# Patient Record
Sex: Female | Born: 1950 | Race: White | Hispanic: No | Marital: Married
Health system: Southern US, Community
[De-identification: ages and names within clinical notes are randomized; demographics above are authoritative.]

---

## 1998-08-28 ENCOUNTER — Other Ambulatory Visit: Admission: RE | Admit: 1998-08-28 | Discharge: 1998-08-28 | Payer: Self-pay | Admitting: *Deleted

## 1999-09-09 ENCOUNTER — Other Ambulatory Visit: Admission: RE | Admit: 1999-09-09 | Discharge: 1999-09-09 | Payer: Self-pay | Admitting: *Deleted

## 2000-10-01 ENCOUNTER — Other Ambulatory Visit: Admission: RE | Admit: 2000-10-01 | Discharge: 2000-10-01 | Payer: Self-pay | Admitting: *Deleted

## 2001-10-18 ENCOUNTER — Other Ambulatory Visit: Admission: RE | Admit: 2001-10-18 | Discharge: 2001-10-18 | Payer: Self-pay | Admitting: Obstetrics and Gynecology

## 2002-11-21 ENCOUNTER — Other Ambulatory Visit: Admission: RE | Admit: 2002-11-21 | Discharge: 2002-11-21 | Payer: Self-pay | Admitting: Obstetrics and Gynecology

## 2003-03-27 ENCOUNTER — Ambulatory Visit (HOSPITAL_COMMUNITY): Admission: RE | Admit: 2003-03-27 | Discharge: 2003-03-27 | Payer: Self-pay | Admitting: Gastroenterology

## 2009-10-16 ENCOUNTER — Encounter: Payer: Self-pay | Admitting: Cardiology

## 2009-10-22 ENCOUNTER — Encounter: Payer: Self-pay | Admitting: Cardiology

## 2009-11-08 ENCOUNTER — Ambulatory Visit: Payer: Self-pay | Admitting: Cardiology

## 2009-11-08 DIAGNOSIS — R079 Chest pain, unspecified: Secondary | ICD-10-CM | POA: Insufficient documentation

## 2009-11-29 ENCOUNTER — Ambulatory Visit: Payer: Self-pay

## 2009-11-29 ENCOUNTER — Encounter: Payer: Self-pay | Admitting: Cardiology

## 2009-11-29 ENCOUNTER — Ambulatory Visit (HOSPITAL_COMMUNITY): Admission: RE | Admit: 2009-11-29 | Discharge: 2009-11-29 | Payer: Self-pay | Admitting: Cardiology

## 2009-11-29 ENCOUNTER — Ambulatory Visit: Payer: Self-pay | Admitting: Cardiology

## 2010-06-13 NOTE — Progress Notes (Signed)
Summary: [facility] [place] Office Visit Note  [facility] at [facility] Visit Note   Imported By: Roderic Ovens 11/16/2009 10:46:40  _____________________________________________________________________  External Attachment:    Type:   Image     Comment:   External Document

## 2010-06-13 NOTE — Assessment & Plan Note (Signed)
Summary: np6/chest pain/   Primary Provider:  Dr. Raquel James   History of Present Illness: 60 yo with minimal past medical history presents for evaluation of chest pain.  Patient has episodes of chest tightness that can last for several hours at a time and resolve spontaneously.  Sometimes there is radiation to the left arm.  The pain is not exertional.  It has been occurring every few days for the last 2 months.  She relates its onset to increased stress at her work.  The tightness seems to come on when the stress is worse.  She had a normal stress test in 2003 after having similar episodes of chest pain.  No exertional dyspnea.    ECG: NSR, T wave flattening in precordial leads.   Labs (6/11): K 3.9, creatinine 0.8, HDL 66, LDL 59, free T4 and TSH normal  Current Medications (verified): 1)  Vitamin D3 2000 Unit Caps (Cholecalciferol) .... Take One Capsule Daily  Allergies (verified): No Known Drug Allergies  Past History:  Past Medical History: Minimal past medical history  Family History: Grandmother with CVA, Grandfather with MI at 61 and CHF.   Social History: Married with 2 children.  Works as a Runner, broadcasting/film/video.  No smoking.  No illicit drugs.   Review of Systems       All systems reviewed and negative except as per HPI.   Vital Signs:  Patient profile:   60 year old female Height:      61.5 inches Weight:      156 pounds BMI:     29.10 Pulse rate:   72 / minute Pulse rhythm:   regular BP sitting:   121 / 88  (left arm) Cuff size:   regular  Vitals Entered By: Judithe Modest CMA (November 08, 2009 2:49 PM)  Physical Exam  General:  Well developed, well nourished, in no acute distress. Head:  normocephalic and atraumatic Nose:  no deformity, discharge, inflammation, or lesions Mouth:  Teeth, gums and palate normal. Oral mucosa normal. Neck:  Neck supple, no JVD. No masses, thyromegaly or abnormal cervical nodes. Lungs:  Clear bilaterally to auscultation and  percussion. Heart:  Non-displaced PMI, chest non-tender; regular rate and rhythm, S1, S2 without murmurs, rubs or gallops. Carotid upstroke normal, no bruit. Pedals normal pulses. No edema, no varicosities. Abdomen:  Bowel sounds positive; abdomen soft and non-tender without masses, organomegaly, or hernias noted. No hepatosplenomegaly. Msk:  Back normal, normal gait. Muscle strength and tone normal. Extremities:  No clubbing or cyanosis. Neurologic:  Alert and oriented x 3. Skin:  Intact without lesions or rashes. Psych:  Normal affect.   Impression & Recommendations:  Problem # 1:  CHEST PAIN (ICD-786.50) Atypical chest pain.  This pain lasts for several hours at a time and is nonexertional.  It seems to be related to stress, and may be a stress response.  I will have her do a stress echo, and if this is normal, no further workup is required.   Other Orders: Stress Echo (Stress Echo)  Patient Instructions: 1)  Your physician has requested that you have a stress echocardiogram. For further information please visit [email or web address]  Please follow instruction sheet as given. 2)  Your physician recommends that you schedule a follow-up appointment as needed with Dr Shirlee Latch.

## 2010-06-13 NOTE — Letter (Signed)
Summary: [facility] [place] Office Visit Note  [facility] at [facility] Visit Note   Imported By: Roderic Ovens 11/16/2009 10:49:39  _____________________________________________________________________  External Attachment:    Type:   Image     Comment:   External Document

## 2010-06-23 ENCOUNTER — Emergency Department (HOSPITAL_COMMUNITY)
Admission: EM | Admit: 2010-06-23 | Discharge: 2010-06-23 | Disposition: A | Discharge status: Home / Self Care | Payer: BC Managed Care – PPO | Attending: Emergency Medicine | Admitting: Emergency Medicine

## 2010-06-23 DIAGNOSIS — I10 Essential (primary) hypertension: Secondary | ICD-10-CM | POA: Insufficient documentation

## 2010-06-23 LAB — URINALYSIS, ROUTINE W REFLEX MICROSCOPIC
Bilirubin Urine: NEGATIVE
Hgb urine dipstick: NEGATIVE
Ketones, ur: NEGATIVE mg/dL
Nitrite: NEGATIVE
Urobilinogen, UA: 0.2 mg/dL (ref 0.0–1.0)

## 2010-06-23 LAB — POCT I-STAT, CHEM 8
Calcium, Ion: 1.2 mmol/L (ref 1.12–1.32)
Glucose, Bld: 102 mg/dL — ABNORMAL HIGH (ref 70–99)
HCT: 42 % (ref 36.0–46.0)
Hemoglobin: 14.3 g/dL (ref 12.0–15.0)
TCO2: 26 mmol/L (ref 0–100)

## 2010-09-27 NOTE — Op Note (Signed)
   NAME:  Dorothy Mason, Dorothy Mason                           ACCOUNT NO.:  [id number]   MEDICAL RECORD NO.:  [id number]                   PATIENT TYPE:  AMB   LOCATION:  ENDO                                 FACILITY:  [facility]   PHYSICIAN:  Anselmo Rod, M.D.               DATE OF BIRTH:  03-02-1951   DATE OF PROCEDURE:  03/27/2003  DATE OF DISCHARGE:                                 OPERATIVE REPORT   PROCEDURE PERFORMED:  Screening colonoscopy.   ENDOSCOPIST:  Charna Elizabeth, M.D.   INSTRUMENT USED:  Olympus video colonoscope.   INDICATIONS FOR PROCEDURE:  The patient is a 60 year old white female  undergoing screening colonoscopy to rule out colonic polyps, masses,  hemorrhoids, etc.   PREPROCEDURE PREPARATION:  Informed consent was procured from the patient.  The patient was fasted for eight hours prior to the procedure and prepped  with a bottle of magnesium citrate and a gallon of GoLYTELY the night prior  to the procedure.   PREPROCEDURE PHYSICAL:  The patient had stable vital signs.  Neck supple.  Chest clear to auscultation.  S1 and S2 regular.  Abdomen soft with normal  bowel sounds.   DESCRIPTION OF PROCEDURE:  The patient was placed in left lateral decubitus  position and sedated with 70 mg of Demerol and 7 mg of Versed intravenously.  Once the patient was adequately sedated and maintained on low flow oxygen  and continuous cardiac monitoring, the Olympus video colonoscope was  advanced from the rectum to the cecum and terminal ileum without difficulty.  The entire exam was normal.  No masses, polyps, erosions, ulcerations or  diverticula were seen.  Retroflexion in the rectum revealed no abnormality.  The patient tolerated the procedure well without complication.   IMPRESSION:  Normal colonoscopy up to the cecum and terminal ileum.  No  masses, polyps, or diverticulosis seen.   RECOMMENDATIONS:  1. A high fiber diet with liberal fluid intake has been advised.  2.     Repeat  colorectal cancer screening is recommended in the next 10 years     unless the patient develops any abnormal symptoms in the interim.  3. Outpatient follow-up as need arises in the future.                                               Anselmo Rod, M.D.    JNM/MEDQ  D:  03/27/2003  T:  03/27/2003  Job:  [id number]   cc:   Talmadge Coventry, M.D.  [address], [address]  [place]  [place] [address]  Fax: [phone]

## 2010-11-14 ENCOUNTER — Encounter: Payer: Self-pay | Admitting: Cardiology

## 2012-05-24 ENCOUNTER — Other Ambulatory Visit: Payer: Self-pay | Admitting: Family Medicine

## 2012-05-24 ENCOUNTER — Other Ambulatory Visit (HOSPITAL_COMMUNITY)
Admission: RE | Admit: 2012-05-24 | Discharge: 2012-05-24 | Disposition: A | Discharge status: Home / Self Care | Payer: BC Managed Care – PPO | Source: Ambulatory Visit | Attending: Family Medicine | Admitting: Family Medicine

## 2012-05-24 DIAGNOSIS — Z124 Encounter for screening for malignant neoplasm of cervix: Secondary | ICD-10-CM | POA: Insufficient documentation

## 2015-05-15 DIAGNOSIS — F419 Anxiety disorder, unspecified: Secondary | ICD-10-CM | POA: Diagnosis not present

## 2015-05-15 DIAGNOSIS — E559 Vitamin D deficiency, unspecified: Secondary | ICD-10-CM | POA: Diagnosis not present

## 2015-05-15 DIAGNOSIS — E039 Hypothyroidism, unspecified: Secondary | ICD-10-CM | POA: Diagnosis not present

## 2015-05-15 DIAGNOSIS — I10 Essential (primary) hypertension: Secondary | ICD-10-CM | POA: Diagnosis not present

## 2015-09-03 DIAGNOSIS — J029 Acute pharyngitis, unspecified: Secondary | ICD-10-CM | POA: Diagnosis not present

## 2015-10-10 DIAGNOSIS — I10 Essential (primary) hypertension: Secondary | ICD-10-CM | POA: Diagnosis not present

## 2015-10-10 DIAGNOSIS — R079 Chest pain, unspecified: Secondary | ICD-10-CM | POA: Diagnosis not present

## 2015-10-10 DIAGNOSIS — R0789 Other chest pain: Secondary | ICD-10-CM | POA: Diagnosis not present

## 2015-11-21 DIAGNOSIS — I10 Essential (primary) hypertension: Secondary | ICD-10-CM | POA: Diagnosis not present

## 2015-11-21 DIAGNOSIS — E039 Hypothyroidism, unspecified: Secondary | ICD-10-CM | POA: Diagnosis not present

## 2015-11-21 DIAGNOSIS — E559 Vitamin D deficiency, unspecified: Secondary | ICD-10-CM | POA: Diagnosis not present

## 2016-02-20 DIAGNOSIS — Z1231 Encounter for screening mammogram for malignant neoplasm of breast: Secondary | ICD-10-CM | POA: Diagnosis not present

## 2016-02-20 DIAGNOSIS — Z803 Family history of malignant neoplasm of breast: Secondary | ICD-10-CM | POA: Diagnosis not present

## 2016-12-15 ENCOUNTER — Other Ambulatory Visit (HOSPITAL_COMMUNITY)
Admission: RE | Admit: 2016-12-15 | Discharge: 2016-12-15 | Disposition: A | Discharge status: Home / Self Care | Payer: Medicare Other | Source: Ambulatory Visit | Attending: Family Medicine | Admitting: Family Medicine

## 2016-12-15 ENCOUNTER — Other Ambulatory Visit: Payer: Self-pay | Admitting: Family Medicine

## 2016-12-15 DIAGNOSIS — Z124 Encounter for screening for malignant neoplasm of cervix: Secondary | ICD-10-CM | POA: Diagnosis present

## 2016-12-17 LAB — CYTOLOGY - PAP
DIAGNOSIS: NEGATIVE
HPV: NOT DETECTED

## 2018-11-04 ENCOUNTER — Encounter: Payer: Self-pay | Admitting: Internal Medicine

## 2019-03-04 ENCOUNTER — Encounter (INDEPENDENT_AMBULATORY_CARE_PROVIDER_SITE_OTHER): Payer: Self-pay

## 2019-05-16 HISTORY — PX: CRANIOTOMY: SHX93

## 2019-06-06 ENCOUNTER — Telehealth: Payer: Self-pay | Admitting: *Deleted

## 2019-06-06 NOTE — Telephone Encounter (Signed)
error 

## 2019-06-08 ENCOUNTER — Encounter: Payer: Self-pay | Admitting: Radiation Therapy

## 2019-06-08 NOTE — Progress Notes (Signed)
New GBM referral received from Dr. Drema Dallas office. Pt needs to be seen for post operative chemo/radiation. (incomplete resection)   Surgeon: Dr. Aura Fey, [facility], [place]. [phone]  Hospital procedure performed: [facility] in [facility]   Date of surgery: 05/16/19  Doctor managing her rehab: Dr. Rayford Halsted.  [id number] Planned discharge from Rehab facility on Monday 1/25  Case Manager: [facility] @ [facility] - [phone]  Pt's husband: [phone], mid-day appointments preferred since driving from [place].    Mont Dutton R.T.(R)(T) Radiation Special Procedures Navigator

## 2019-06-13 ENCOUNTER — Ambulatory Visit
Admission: RE | Admit: 2019-06-13 | Discharge: 2019-06-13 | Disposition: A | Discharge status: Home / Self Care | Payer: Self-pay | Source: Ambulatory Visit | Attending: Radiation Oncology | Admitting: Radiation Oncology

## 2019-06-13 ENCOUNTER — Other Ambulatory Visit: Payer: Self-pay | Admitting: Radiation Oncology

## 2019-06-13 DIAGNOSIS — C719 Malignant neoplasm of brain, unspecified: Secondary | ICD-10-CM

## 2019-06-15 ENCOUNTER — Inpatient Hospital Stay
Admission: RE | Admit: 2019-06-15 | Discharge: 2019-06-15 | Disposition: A | Discharge status: Home / Self Care | Payer: Self-pay | Source: Ambulatory Visit | Attending: Internal Medicine | Admitting: Internal Medicine

## 2019-06-15 ENCOUNTER — Other Ambulatory Visit (HOSPITAL_COMMUNITY): Payer: Self-pay | Admitting: Internal Medicine

## 2019-06-15 ENCOUNTER — Ambulatory Visit
Admission: RE | Admit: 2019-06-15 | Discharge: 2019-06-15 | Disposition: A | Discharge status: Home / Self Care | Payer: Self-pay | Source: Ambulatory Visit | Attending: Internal Medicine | Admitting: Internal Medicine

## 2019-06-15 DIAGNOSIS — C801 Malignant (primary) neoplasm, unspecified: Secondary | ICD-10-CM

## 2019-06-20 ENCOUNTER — Telehealth: Payer: Self-pay | Admitting: Internal Medicine

## 2019-06-20 ENCOUNTER — Other Ambulatory Visit: Payer: Self-pay

## 2019-06-20 ENCOUNTER — Inpatient Hospital Stay: Payer: Medicare PPO | Attending: Internal Medicine | Admitting: Internal Medicine

## 2019-06-20 DIAGNOSIS — Z79899 Other long term (current) drug therapy: Secondary | ICD-10-CM | POA: Insufficient documentation

## 2019-06-20 DIAGNOSIS — C719 Malignant neoplasm of brain, unspecified: Secondary | ICD-10-CM | POA: Diagnosis present

## 2019-06-20 DIAGNOSIS — C711 Malignant neoplasm of frontal lobe: Secondary | ICD-10-CM | POA: Insufficient documentation

## 2019-06-20 NOTE — Progress Notes (Signed)
[facility] [place], [place] [address] [phone]   New Patient Evaluation  Date of Service: 06/20/19 Patient Name: Dorothy Mason Patient MRN: [id number] Patient DOB: 1950/08/27 Provider: Ventura Sellers, MD  Identifying Statement:  Dorothy Mason is a 69 y.o. female with left frontal glioblastoma who presents for initial consultation and evaluation.    Referring Provider: No referring provider defined for this encounter.  Oncologic History: Oncology History  Glioblastoma, IDH-wildtype ([place])  05/16/2019 Surgery   Craniotomy, debulking resection in [place], [place]     Biomarkers:  MGMT Unknown.  IDH 1/2 Wild type.  EGFR Unknown  TERT Unknown   History of Present Illness: The patient's records from the referring physician were obtained and reviewed and the patient interviewed to confirm this HPI.  Dorothy Mason initially presented to medical attention in late December with several weeks of progressive word finding difficuly, confusion, and gait impairment.  She obtained CNS imaging locally in [place], [place] at request of her PCP which demonstrated enhancing frontal masses consistent with primary brain tumor.  She then underwent craniotomy and resection, also in [place], for debulking of the mass.  This was followed by several weeks of inpatient rehabilitation, where she was discharged with independent gait and otherwise fully intact.  She does acknowledge some difficulty with communication, and "slower" pace of activities such as dressing in the morning.  She denies any seizures or headaches, and has fully weaned off dexamethasone.      Medications: Current Outpatient Medications on File Prior to Visit  Medication Sig Dispense Refill  . Cholecalciferol (VITAMIN D3) 2000 UNITS capsule Take 2,000 Units by mouth daily.       No current facility-administered medications on file prior to visit.    Allergies:  Allergies   Allergen Reactions  . Amoxicillin    Past Medical History: No past medical history on file. Past Surgical History: none  Social History:  Social History   Socioeconomic History  . Marital status: Single    Spouse name: Not on file  . Number of children: Not on file  . Years of education: Not on file  . Highest education level: Not on file  Occupational History  . Not on file  Tobacco Use  . Smoking status: Unknown If Ever Smoked  . Tobacco comment: no smoking   Substance and Sexual Activity  . Alcohol use: No  . Drug use: Not on file  . Sexual activity: Not on file  Other Topics Concern  . 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    Social Determinants of Health   Financial Resource Strain:   . Difficulty of Paying Living Expenses: Not on file  Food Insecurity:   . Worried About Charity fundraiser in the Last Year: Not on file  . Ran Out of Food in the Last Year: Not on file  Transportation Needs:   . Lack of Transportation (Medical): Not on file  . Lack of Transportation (Non-Medical): Not on file  Physical Activity:   . Days of Exercise per Week: Not on file  . Minutes of Exercise per Session: Not on file  Stress:   . Feeling of Stress : Not on file  Social Connections:   . Frequency of Communication with Friends and Family: Not on file  . Frequency of Social Gatherings with Friends and Family: Not on file  .  Attends Religious Services: Not on file  . Active Member of Clubs or Organizations: Not on file  . Attends Archivist Meetings: Not on file  . Marital Status: Not on file  Intimate Partner Violence:   . Fear of Current or Ex-Partner: Not on file  . Emotionally Abused: Not on file  . Physically Abused: Not on file  . Sexually Abused: Not on file   Family History:  Family History  Problem Relation Age of Onset  . Stroke Unknown        grandmother  . Heart attack Unknown        grandfather - 15  and CHF     Review of Systems: Constitutional: Doesn't report fevers, chills or abnormal weight loss Eyes: Doesn't report blurriness of vision Ears, nose, mouth, throat, and face: Doesn't report sore throat Respiratory: Doesn't report cough, dyspnea or wheezes Cardiovascular: Doesn't report palpitation, chest discomfort  Gastrointestinal:  Doesn't report nausea, constipation, diarrhea GU: Doesn't report incontinence Skin: Doesn't report skin rashes Neurological: Per HPI Musculoskeletal: Doesn't report joint pain Behavioral/Psych: Doesn't report anxiety  Physical Exam: Vitals:   06/20/19 1047  BP: 109/70  Pulse: 77  Resp: 18  Temp: 97.8 F (36.6 C)  SpO2: 97%   KPS: 80. General: Alert, cooperative, pleasant, in no acute distress Head: Craniotomy scar EENT: No conjunctival injection or scleral icterus.  Lungs: Resp effort normal Cardiac: Regular rate Abdomen: Non-distended abdomen Skin: No rashes cyanosis or petechiae. Extremities: No clubbing or edema  Neurologic Exam: Mental Status: Awake, alert, attentive to examiner. Oriented to self and environment. Language has some impairment with fluency and comprehension.  Age advanced psychomotor slowing.  Cranial Nerves: Visual acuity is grossly normal. Visual fields are full. Extra-ocular movements intact. No ptosis. Face is symmetric Motor: Tone and bulk are normal. Power is full in both arms and legs. Reflexes are symmetric, no pathologic reflexes present.  Sensory: Intact to light touch Gait: Normal, deferred tandem.   Labs: I have reviewed the data as listed    Component Value Date/Time   NA 142 06/23/2010 0053   K 3.2 (L) 06/23/2010 0053   CL 104 06/23/2010 0053   GLUCOSE 102 (H) 06/23/2010 0053   BUN 13 06/23/2010 0053   CREATININE 0.9 06/23/2010 0053   Lab Results  Component Value Date   HGB 14.3 06/23/2010   HCT 42.0 06/23/2010    Pre-op MRI:   Post-op MRI:     Pathology (outside report, pending  scan into Epic): Glioblastoma, IDH-wild type with giant cell features  Assessment/Plan Glioblastoma, IDH-wildtype ([place])  We appreciate the opportunity to participate in the care of Dorothy Mason.  We extensively discussed her pathology, imaging, and clinical findings.  We reviewed prognosis and treatment pathways for glioblastoma.  We are encouraged by her good quality resection and nearly full recovery of functional status through aggressive rehabilitation.    Further tumor bio-markers are pending and may be expanded through whole exome sequencing if deemed appropriate.  We recommended moving forward with 42-day intensity-modulated radiation therapy with concurrent Temozolomide, dosed at '75mg'$ /m2 daily.  We reviewed side effecits of Temozolomide including fatigue, cytopenias, nausea/vomting, constipation.  Chemotherapy should be held for the following:  ANC less than 1,000  Platelets less than 100,000  LFT or creatinine greater than 2x ULN  If clinical concerns/contraindications develop    She will meet with radiation oncology tomorrow to discuss plans and timing for radiation therapy.  We are happy to see her for further discussion  when she returns for CT-sim.  Keppra may be discontinued now that she is removed from post-operative prophylaxis period.    Screening for potential clinical trials was performed and discussed using eligibility criteria for active protocols at [facility], loco-regional tertiary centers, as well as national database available on [email or web address].com.    The patient is not a candidate for a research protocol at this time due to no suitable study identified.   We spent twenty additional minutes teaching regarding the natural history, biology, and historical experience in the treatment of brain tumors. We then discussed in detail the current recommendations for therapy focusing on the mode of administration, mechanism of action, anticipated toxicities, and  quality of life issues associated with this plan. We also provided teaching sheets for the patient to take home as an additional resource.  She may return for further evaluation following CT-sim, and then will return to clinic during weeks 2, 4 and 6 of IMRT with labs for evaluation.  All questions were answered. The patient knows to call the clinic with any problems, questions or concerns. No barriers to learning were detected.  The total time spent in the encounter was 60 minutes and more than 50% was on counseling and review of test results   Ventura Sellers, MD Medical Director of Neuro-Oncology [facility] at [place] 06/20/19 10:48 AM

## 2019-06-20 NOTE — Telephone Encounter (Signed)
No los per 2/8. 

## 2019-06-20 NOTE — Progress Notes (Signed)
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  Residual tumor both enhancing and nonenhancing in both frontal lobes.  No ischemic infarcts seen.  No acute hematoma has developed post surgery.  CT AP 05/11/2019: No evidence of metastatic disease to the abdomen or pelvis.  CT Head 05/11/2019: Multiple cystic and solid enhancing lesions, suggestive of metastases.  Synchronous primary tumor is felt to be less likely.  CT Chest 05/11/2019: 4 mm subpleural nodular density lateral aspect of the right middle lobe.  No pulmonary parenchymal mass or adenopathy is identified.  Multifocal nonspecific ground glass pulmonary parenchymal density possibly representing mild edema or pneumonitis.  Multiple small sub-centimeter thyroid hypodensities possibly representing cysts and or nodules.  CT Head 05/10/2019: Large cystic lesion with surrounding edema and associated mass effect located in the left anterior parietal lobe with smaller cystic lesion in the right anterior parietal lobe.  There are highly concerning for malignancy.  MRI Brain 05/10/2019: Multiple enhancing lesions as detailed suspicious for metastatic neoplastic disease.  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  Past or anticipated interventions, if any, per neurosurgery:  Dr.Andrew Rice-[place] [facility] -05/16/19 left craniotomy   Past or anticipated interventions, if any, per medical oncology:  Dr. Mickeal Skinner 06/20/2019 -We recommend moving forward with 42-day intensity-modulated radiation therapy with concurrent temozolomide (temodar), dosed at 75 mg/m2 daily.   Dose of Decadron, if applicable: No   Recent neurologic symptoms, if any:   Seizures:  No  Headaches: occasional pulsing headaches, nothing major.  Nausea: No  Dizziness/ataxia: No  Difficulty with hand coordination: No  Focal numbness/weakness: No  Visual deficits/changes: No  Confusion/Memory deficits: Yes, some.   SAFETY ISSUES:  Prior radiation? No  Pacemaker/ICD? No  Possible current pregnancy?   Is the patient on methotrexate? No  Additional Complaints / other details:  -Transitioning off seizure meds. -Lives in [place]

## 2019-06-21 ENCOUNTER — Encounter: Payer: Self-pay | Admitting: Radiation Oncology

## 2019-06-21 ENCOUNTER — Other Ambulatory Visit: Payer: Self-pay

## 2019-06-21 ENCOUNTER — Ambulatory Visit
Admission: RE | Admit: 2019-06-21 | Discharge: 2019-06-21 | Disposition: A | Discharge status: Home / Self Care | Payer: Medicare PPO | Source: Ambulatory Visit | Attending: Radiation Oncology | Admitting: Radiation Oncology

## 2019-06-21 VITALS — Ht 61.0 in | Wt 146.0 lb

## 2019-06-21 DIAGNOSIS — F40240 Claustrophobia: Secondary | ICD-10-CM

## 2019-06-21 DIAGNOSIS — C719 Malignant neoplasm of brain, unspecified: Secondary | ICD-10-CM

## 2019-06-21 DIAGNOSIS — C711 Malignant neoplasm of frontal lobe: Secondary | ICD-10-CM | POA: Insufficient documentation

## 2019-06-21 DIAGNOSIS — Z510 Encounter for antineoplastic radiation therapy: Secondary | ICD-10-CM | POA: Insufficient documentation

## 2019-06-21 MED ORDER — LORAZEPAM 0.5 MG PO TABS: ORAL_TABLET | ORAL | 0 refills | Status: AC

## 2019-06-21 NOTE — Progress Notes (Signed)
Radiation Oncology         [phone] ________________________________  Initial Outpatient Consultation - Conducted via telephone due to current COVID-19 concerns for limiting patient exposure  I spoke with the patient to conduct this consult visit via telephone to spare the patient unnecessary potential exposure in the healthcare setting during the current COVID-19 pandemic. The patient was notified in advance and was offered a [place] meeting to allow for face to face communication but unfortunately reported that they did not have the appropriate resources/technology to support such a visit and instead preferred to proceed with a telephone consult.   ________________________________  Name: Dorothy Mason        MRN: [id number]  Date of Service: 06/21/2019 DOB: 03/11/1951  KZ:[id number], Provider Not In  Essie Christine, MD     REFERRING PHYSICIAN: Essie Christine, MD   DIAGNOSIS: The primary encounter diagnosis was Glioblastoma, IDH-wildtype ([place]). A diagnosis of Claustrophobia was also pertinent to this visit.   HISTORY OF PRESENT ILLNESS: Dorothy Mason is a 69 y.o. female seen at the request of Dr. Mickeal Skinner and Dr. Drema Dallas for a recently diagnosed glioblastoma multiforme.  The patient has been living in [facility].  In December 2020 she developed word finding difficulty, confusion and gait impairment.  She proceeded with evaluation and underwent an MRI of the brain on 05/10/2019.  This revealed a 5.8 x 4.3 x 5.2 cm mixed solid and cystic mass involving the left frontal right lobe with avid enhancement of the solid component and curvilinear enhancement along the periphery of the cystic component.  There was moderate vasogenic edema the mass effaces posteriorly displacing the frontal horn in the left lateral ventricle and partially effaces the frontal horn of the right lateral ventricle.  The mass did extend across the midline 10 to 11 mm.  There was also an enhancing mass superior  laterally adjacent to the right lateral ventricle at the level of the junction of the frontal horn and body laterally adjacent to the mass there was a 16 x 12 x 15 mm focus of subacute parenchymal hemorrhage.  There were also 2 subcentimeter foci of acute or early subacute parenchymal hemorrhage anterior and medial to the collection and a 5 to 6 mm enhancing nodule in the superior lateral aspect of the left lateral ventricle at the junction of the body and atrium concerning for ependymal enhancement.  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  Metastatic work-up was negative.  She subsequently underwent surgical resection on 05/16/2019.  Final pathology was consistent with glioblastoma.  Postoperative imaging on 05/17/2019 revealed evidence of left frontal craniotomy with subtotal tumor resection debulking of the medial enhancing margin of the tumor was noted and residual enhancement medially with slight subfalcine herniation was unchanged.  Rim of enhancing tumor seen extending to the left frontal horn was noted as well and unchanged.  The small hemorrhagic tumor nodule is identified in the right frontal lobe that was stable and tumoral cyst extending to the right frontal horn that was also unchanged.  No intraventricular seeding of tumor was noted.  She did have several weeks of inpatient rehabilitation and was discharged with an independent gait.  She met with Dr. 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  She is contacted today to discuss the rationale for chemoradiation.  After meeting  with Dr. Mickeal Skinner, the plan is to move forward in the near future with radiotherapy to her persistent enhancement in surgical cavity.    PREVIOUS RADIATION THERAPY: No   PAST MEDICAL HISTORY: History reviewed. No pertinent past medical history.     PAST SURGICAL HISTORY: Past  Surgical History:  Procedure Laterality Date  . CRANIOTOMY  05/16/2019   resection of glioblastoma.     FAMILY HISTORY:  Family History  Problem Relation Age of Onset  . Stroke Other        grandmother  . Heart attack Other        grandfather - 25 and CHF      SOCIAL HISTORY:  has an unknown smoking status. She has never used smokeless tobacco. She reports that she does not drink alcohol.  The patient is married.  She lives in [place], she is a retired Magazine features editor in the [employer]    ALLERGIES: Amoxicillin   MEDICATIONS:  Current Outpatient Medications  Medication Sig Dispense Refill  . amLODipine (NORVASC) 5 MG tablet Take 5 mg by mouth daily.    Marland Kitchen aspirin 325 MG tablet Take 325 mg by mouth daily.    . Cholecalciferol (VITAMIN D3) 2000 UNITS capsule Take 2,000 Units by mouth daily.      Marland Kitchen lisinopril (ZESTRIL) 40 MG tablet Take 1 tablet by mouth daily.    Marland Kitchen LORazepam (ATIVAN) 0.5 MG tablet 1 tablet po 30 minutes prior to radiation or MRI 40 tablet 0   No current facility-administered medications for this encounter.     REVIEW OF SYSTEMS: On review of systems, the patient reports that she is doing well overall. She denies any chest pain, shortness of breath, cough, fevers, chills, night sweats, unintended weight changes. She reports no gait disturbance. Her husband confirms that she is not confused or having any changes in speech or behavior. She denies any bowel or bladder disturbances, and denies abdominal pain, nausea or vomiting. She denies any new musculoskeletal or joint aches or pains. A complete review of systems is obtained and is otherwise negative.     PHYSICAL EXAM:  Unable to assess due to encounter type.  ECOG = 0  0 - Asymptomatic (Fully active, able to carry on all predisease activities without restriction)  1 - Symptomatic but completely ambulatory (Restricted in physically strenuous activity but  ambulatory and able to carry out work of a light or sedentary nature. For example, light housework, office work)  2 - Symptomatic, <50% in bed during the day (Ambulatory and capable of all self care but unable to carry out any work activities. Up and about more than 50% of waking hours)  3 - Symptomatic, >50% in bed, but not bedbound (Capable of only limited self-care, confined to bed or chair 50% or more of waking hours)  4 - Bedbound (Completely disabled. Cannot carry on any self-care. Totally confined to bed or chair)  5 - Death   Eustace Pen MM, Creech RH, Tormey DC, et al. [phone]). "Toxicity and response criteria of the [facility]". [place] Oncol. 5 (6): 649-55    LABORATORY DATA:  Lab Results  Component Value Date   HGB 14.3 06/23/2010   HCT 42.0 06/23/2010   Lab Results  Component Value Date   NA 142 06/23/2010   K 3.2 (L) 06/23/2010   CL 104 06/23/2010   No results found for: ALT, AST, GGT, ALKPHOS, BILITOT    RADIOGRAPHY: No results  found.     IMPRESSION/PLAN: 1. Bifrontal glioblastoma. Dr. Lisbeth Renshaw discusses the pathology findings and reviews the nature of primary brain malignancy.  He discusses the rationale for postoperative chemoradiation to the surgical site and any areas of persistent enhancement. We also discussed alternative therapies such as Optune. At the conclusion of this discussion she elects with chemoRT. We discussed the risks, benefits, short, and long term effects of radiotherapy, and the patient is interested in proceeding. Dr. Lisbeth Renshaw discusses the delivery and logistics of radiotherapy and anticipates a course of 6 weeks of radiotherapy.  She will return for simulation on Thursday at which time she will signed written consent to proceed. We anticipate starting treatment on 07/04/19 due to their plans to be in [place] next week for the second covid vaccine. 2. Claustrophobia. We discussed the rationale to use Ativan prior to simulation, as  well as if needed for treatment or subsequent MRI studies. She is in agreement to try this after we reviewed the side effect profile. A new rx was eprescribed to her pharmacy. 3. Transportation considerations. The patient is going to be temporarily staying in [place] for the duration of radiotherapy. She lived in [place] prior to retirement and only recently relocated to [place]. She plans to continue medical care in [place].   Given current concerns for patient exposure during the COVID-19 pandemic, this encounter was conducted via telephone.  The patient has given verbal consent for this type of encounter. The time spent during this encounter was 60 minutes and 50% of that time was spent in the preparation, discussion, and coordination of her care. The attendants for this meeting include Dr. Lisbeth Renshaw, Shona Simpson, [facility] and Hanley Hays and her husband C.S. Rosana Berger. During the encounter, Dr. Lisbeth Renshaw and Shona Simpson [facility] were located at [facility] Radiation Oncology Department.  Hanley Hays and her husband were located at a hotel they plan to stay at during treatment.  The above documentation reflects my direct findings during this shared patient visit. Please see the separate note by Dr. Lisbeth Renshaw on this date for the remainder of the patient's plan of care.    Carola Rhine, PAC

## 2019-06-22 DIAGNOSIS — F40240 Claustrophobia: Secondary | ICD-10-CM | POA: Insufficient documentation

## 2019-06-22 NOTE — Addendum Note (Signed)
Encounter addended by: Kyung Rudd, MD on: 06/22/2019 8:38 AM  Actions taken: Problem List modified, Visit diagnoses modified

## 2019-06-23 ENCOUNTER — Ambulatory Visit: Admission: RE | Admit: 2019-06-23 | Payer: Medicare PPO | Source: Ambulatory Visit | Admitting: Radiation Oncology

## 2019-06-23 DIAGNOSIS — C711 Malignant neoplasm of frontal lobe: Secondary | ICD-10-CM | POA: Diagnosis present

## 2019-06-23 DIAGNOSIS — Z510 Encounter for antineoplastic radiation therapy: Secondary | ICD-10-CM | POA: Diagnosis present

## 2019-06-24 ENCOUNTER — Telehealth: Payer: Self-pay | Admitting: *Deleted

## 2019-06-24 ENCOUNTER — Telehealth: Payer: Self-pay | Admitting: Pharmacist

## 2019-06-24 ENCOUNTER — Other Ambulatory Visit: Payer: Self-pay | Admitting: Internal Medicine

## 2019-06-24 DIAGNOSIS — C711 Malignant neoplasm of frontal lobe: Secondary | ICD-10-CM

## 2019-06-24 MED ORDER — LEVETIRACETAM 500 MG PO TABS: 500.0000 mg | ORAL_TABLET | Freq: Two times a day (BID) | ORAL | Status: DC

## 2019-06-24 MED ORDER — ONDANSETRON HCL 8 MG PO TABS: 8.0000 mg | ORAL_TABLET | Freq: Two times a day (BID) | ORAL | 1 refills | Status: DC | PRN

## 2019-06-24 MED ORDER — TEMOZOLOMIDE 100 MG PO CAPS: 100.0000 mg | ORAL_CAPSULE | Freq: Every day | ORAL | 0 refills | Status: AC

## 2019-06-24 MED ORDER — TEMOZOLOMIDE 20 MG PO CAPS: 20.0000 mg | ORAL_CAPSULE | Freq: Every day | ORAL | 0 refills | Status: AC

## 2019-06-24 NOTE — Progress Notes (Signed)
START ON PATHWAY REGIMEN - Neuro     One cycle, daily for 42 days concurrent with RT:     Temozolomide   **Always confirm dose/schedule in your pharmacy ordering system**  Patient Characteristics: Glioblastoma (Grade IV Glioma), Newly Diagnosed / Treatment Naive, Good Performance Status and/or Younger Patient, MGMT Promoter Unmethylated/Unknown Disease Classification: Glioma Disease Classification: Glioblastoma (Grade IV Glioma) Disease Status: Newly Diagnosed / Treatment Naive Performance Status: Good Performance Status and/or Younger Patient MGMT Promoter Methylation Status: Awaiting Test Results Intent of Therapy: Non-Curative / Palliative Intent, Discussed with Patient 

## 2019-06-24 NOTE — Telephone Encounter (Signed)
Oral Oncology Pharmacist Encounter  Received new prescription for Temodar (temozolomide) for the treatment of glioblastoma in conjunction with RT, planned duration until the end of radiation.  Labs orders entered. Prescription dose and frequency assessed.   Current medication list in Epic reviewed, no DDIs with temozolomide identified.  Prescription has been e-scribed to the [facility] for benefits analysis and approval.  Oral Oncology Clinic will continue to follow for insurance authorization, copayment issues, initial counseling and start date.  Darl Pikes, PharmD, BCPS, [facility] Hematology/Oncology Clinical Pharmacist [facility] [place] [facility] [phone]  06/24/2019 4:32 PM

## 2019-06-24 NOTE — Telephone Encounter (Signed)
Received vm message from patient stating that she is having side effects from a medication she stopped taking earlier in the week.  She had stopped her Keppra on Tuesday per Dr. Mickeal Skinner. And pt states she developed some tremors.  TCT patient and spoke with patient and then with her husband. Husband states she would like to resume the [place]. I had discussed this earlier with Dr. Mickeal Skinner and advised husband that Dr. Mauri Reading is ok with pt resuming the [place].  Husband states she she has a dry cough but thinks it is due to the Lisinopril. Advised to discuss this with her PCP who ordered this originally.  Husband voiced understanding. No further questions or concerns

## 2019-06-27 ENCOUNTER — Telehealth: Payer: Self-pay

## 2019-06-27 NOTE — Telephone Encounter (Signed)
Oral Oncology Patient Advocate Encounter  Received notification from [facility] that prior authorization for Temodar is required.  PA submitted on CoverMyMeds Key [id number] and [id number] Status is pending  Oral Oncology Clinic will continue to follow.  [place] Patient [place] Phone [phone] Fax [phone] 06/27/2019 9:53 AM

## 2019-06-27 NOTE — Telephone Encounter (Signed)
Oral Oncology Patient Advocate Encounter  Prior Authorization for Temodar has been approved.    PA# [id number] and [id number] Effective dates: 06/27/19 through 05/11/20  Patients co-pay is $50/each  Oral Oncology Clinic will continue to follow.   [place] Patient [place] Phone [phone] Fax [phone] 06/27/2019 10:46 AM

## 2019-06-29 ENCOUNTER — Encounter: Payer: Self-pay | Admitting: Radiation Oncology

## 2019-06-29 ENCOUNTER — Telehealth: Payer: Self-pay | Admitting: Internal Medicine

## 2019-06-29 NOTE — Telephone Encounter (Signed)
Oral Chemotherapy Pharmacist Encounter  Patient is having her son pick up the Temodar from [place] Friday 07/01/19. She knows that plan is for her to start the Temodar along with her radiation.  Patient Education I spoke with patient for overview of new oral chemotherapy medication: Temodar (temozolomide) for the treatment of glioblastoma in conjunction with RT, planned duration until the end of radiation.   Counseled patient on administration, dosing, side effects, monitoring, drug-food interactions, safe handling, storage, and disposal. Patient will take one 100 mg capsule and one 20mg  capsule by mouth daily. May take on an empty stomach to decrease nausea & vomiting.  Side effects include but not limited to: constipation, N/V, fatigue.    Reviewed with patient importance of keeping a medication schedule and plan for any missed doses.  Ms. Rutherford voiced understanding and appreciation. All questions answered. Medication handout placed in the mail.  Provided patient with Oral [place] [facility] phone number. Patient knows to call the office with questions or concerns. Oral Chemotherapy Navigation Clinic will continue to follow.  Darl Pikes, PharmD, BCPS, [facility] Hematology/Oncology Clinical Pharmacist [facility] Oral [place] [facility] [phone]  06/29/2019 4:31 PM

## 2019-06-29 NOTE — Telephone Encounter (Signed)
I left a message regarding schedule  

## 2019-06-30 MED FILL — TEMOZOLOMIDE 20 MG CAPS: 20 | 28 days supply | Qty: 28 | Fill #0

## 2019-06-30 MED FILL — ONDANSETRON HCL 8 MG TABLET: 8 | 15 days supply | Qty: 30 | Fill #0

## 2019-06-30 MED FILL — TEMOZOLOMIDE 100 MG CAPS: 100 | 28 days supply | Qty: 28 | Fill #0

## 2019-07-01 DIAGNOSIS — Z510 Encounter for antineoplastic radiation therapy: Secondary | ICD-10-CM | POA: Diagnosis not present

## 2019-07-04 ENCOUNTER — Telehealth: Payer: Self-pay | Admitting: *Deleted

## 2019-07-04 ENCOUNTER — Other Ambulatory Visit: Payer: Self-pay

## 2019-07-04 ENCOUNTER — Ambulatory Visit
Admission: RE | Admit: 2019-07-04 | Discharge: 2019-07-04 | Disposition: A | Discharge status: Home / Self Care | Payer: Medicare PPO | Source: Ambulatory Visit | Attending: Radiation Oncology | Admitting: Radiation Oncology

## 2019-07-04 DIAGNOSIS — Z510 Encounter for antineoplastic radiation therapy: Secondary | ICD-10-CM | POA: Diagnosis not present

## 2019-07-04 NOTE — Telephone Encounter (Signed)
Patients spouse called to discuss temodar dosing and instructions.  They stated that they just acquired the medication and were unable to take first dose last night.  Reviewed instructions directly with spouse and he denied any further questions at this point.

## 2019-07-05 ENCOUNTER — Ambulatory Visit
Admission: RE | Admit: 2019-07-05 | Discharge: 2019-07-05 | Disposition: A | Discharge status: Home / Self Care | Payer: Medicare PPO | Source: Ambulatory Visit | Attending: Radiation Oncology | Admitting: Radiation Oncology

## 2019-07-05 ENCOUNTER — Other Ambulatory Visit: Payer: Self-pay

## 2019-07-05 DIAGNOSIS — Z510 Encounter for antineoplastic radiation therapy: Secondary | ICD-10-CM | POA: Diagnosis not present

## 2019-07-06 ENCOUNTER — Other Ambulatory Visit: Payer: Self-pay

## 2019-07-06 ENCOUNTER — Ambulatory Visit
Admission: RE | Admit: 2019-07-06 | Discharge: 2019-07-06 | Disposition: A | Discharge status: Home / Self Care | Payer: Medicare PPO | Source: Ambulatory Visit | Attending: Radiation Oncology | Admitting: Radiation Oncology

## 2019-07-06 ENCOUNTER — Telehealth: Payer: Self-pay | Admitting: *Deleted

## 2019-07-06 DIAGNOSIS — Z510 Encounter for antineoplastic radiation therapy: Secondary | ICD-10-CM | POA: Diagnosis not present

## 2019-07-06 NOTE — Telephone Encounter (Signed)
Left a message for Dorothy Mason to go over her medication regimen.  She is to take her temodar 120mg  daily.  Call back number left.  Will continue to follow as necessary.  Gloriajean Dell. Leonie Green, BSN

## 2019-07-07 ENCOUNTER — Telehealth: Payer: Self-pay | Admitting: *Deleted

## 2019-07-07 ENCOUNTER — Encounter: Payer: Self-pay | Admitting: Internal Medicine

## 2019-07-07 ENCOUNTER — Other Ambulatory Visit: Payer: Self-pay

## 2019-07-07 ENCOUNTER — Ambulatory Visit
Admission: RE | Admit: 2019-07-07 | Discharge: 2019-07-07 | Disposition: A | Discharge status: Home / Self Care | Payer: Medicare PPO | Source: Ambulatory Visit | Attending: Radiation Oncology | Admitting: Radiation Oncology

## 2019-07-07 DIAGNOSIS — Z510 Encounter for antineoplastic radiation therapy: Secondary | ICD-10-CM | POA: Diagnosis not present

## 2019-07-07 NOTE — Telephone Encounter (Signed)
Radiation Oncology notified Neuro Onc today that there appears to be some mishandling of the Temodar dosing.  Per notes see that Oral Oncology Pharmacist spoke with patient and I also spoke with husband on Monday when he called to question dosing instructions.  Per [place] patient is taking dose in middle of day and is not taking both tablets (100 mg and 20 mg tablets) at the same time.  Routed to Oral Pharmacist to see if they could call again and speak to the patient and her husband to reiterate instructions again.

## 2019-07-08 ENCOUNTER — Ambulatory Visit
Admission: RE | Admit: 2019-07-08 | Discharge: 2019-07-08 | Disposition: A | Discharge status: Home / Self Care | Payer: Medicare PPO | Source: Ambulatory Visit | Attending: Radiation Oncology | Admitting: Radiation Oncology

## 2019-07-08 ENCOUNTER — Other Ambulatory Visit: Payer: Self-pay

## 2019-07-08 DIAGNOSIS — C711 Malignant neoplasm of frontal lobe: Secondary | ICD-10-CM

## 2019-07-08 DIAGNOSIS — Z510 Encounter for antineoplastic radiation therapy: Secondary | ICD-10-CM | POA: Diagnosis not present

## 2019-07-08 MED ORDER — SONAFINE EX EMUL
1.0000 "application " | Freq: Once | CUTANEOUS | Status: AC
  Administered 2019-07-08: 1 via TOPICAL

## 2019-07-08 NOTE — Progress Notes (Signed)
Pt here for patient teaching.  Pt given Radiation and You booklet, skin care instructions and Sonafine.  Reviewed areas of pertinence such as fatigue, hair loss, nausea and vomiting, skin changes, headache and blurry vision . Pt able to give teach back of to pat skin and use unscented/gentle soap,apply Sonafine bid and avoid applying anything to skin within 4 hours of treatment. Pt verbalizes understanding of information given and will contact nursing with any questions or concerns.    Aldwin Micalizzi M. Nychelle Cassata RN, BSN      

## 2019-07-11 ENCOUNTER — Other Ambulatory Visit: Payer: Self-pay

## 2019-07-11 ENCOUNTER — Ambulatory Visit
Admission: RE | Admit: 2019-07-11 | Discharge: 2019-07-11 | Disposition: A | Discharge status: Home / Self Care | Payer: Medicare PPO | Source: Ambulatory Visit | Attending: Radiation Oncology | Admitting: Radiation Oncology

## 2019-07-11 DIAGNOSIS — C711 Malignant neoplasm of frontal lobe: Secondary | ICD-10-CM | POA: Insufficient documentation

## 2019-07-11 DIAGNOSIS — Z510 Encounter for antineoplastic radiation therapy: Secondary | ICD-10-CM | POA: Diagnosis present

## 2019-07-12 ENCOUNTER — Ambulatory Visit
Admission: RE | Admit: 2019-07-12 | Discharge: 2019-07-12 | Disposition: A | Discharge status: Home / Self Care | Payer: Medicare PPO | Source: Ambulatory Visit | Attending: Radiation Oncology | Admitting: Radiation Oncology

## 2019-07-12 ENCOUNTER — Telehealth: Payer: Self-pay | Admitting: *Deleted

## 2019-07-12 ENCOUNTER — Other Ambulatory Visit: Payer: Self-pay

## 2019-07-12 DIAGNOSIS — Z510 Encounter for antineoplastic radiation therapy: Secondary | ICD-10-CM | POA: Diagnosis not present

## 2019-07-12 NOTE — Telephone Encounter (Signed)
Spoke with husband regarding medication concerns and dosing.  He reiterates that patient has figured out how to take properly without any associated nausea.   Also received call from [facility] [phone].  They will be providing in home palliative care.  Assessment was completed yesterday.  Requesting records, notes to include diagnosis, prognosis and treatment plan to be faxed to [phone].  Completed.  Mechele Collin, NP will be providing in home care.

## 2019-07-13 ENCOUNTER — Other Ambulatory Visit: Payer: Self-pay

## 2019-07-13 ENCOUNTER — Ambulatory Visit
Admission: RE | Admit: 2019-07-13 | Discharge: 2019-07-13 | Disposition: A | Discharge status: Home / Self Care | Payer: Medicare PPO | Source: Ambulatory Visit | Attending: Radiation Oncology | Admitting: Radiation Oncology

## 2019-07-13 DIAGNOSIS — Z510 Encounter for antineoplastic radiation therapy: Secondary | ICD-10-CM | POA: Diagnosis not present

## 2019-07-14 ENCOUNTER — Ambulatory Visit
Admission: RE | Admit: 2019-07-14 | Discharge: 2019-07-14 | Disposition: A | Discharge status: Home / Self Care | Payer: Medicare PPO | Source: Ambulatory Visit | Attending: Radiation Oncology | Admitting: Radiation Oncology

## 2019-07-14 ENCOUNTER — Other Ambulatory Visit: Payer: Self-pay

## 2019-07-14 DIAGNOSIS — Z510 Encounter for antineoplastic radiation therapy: Secondary | ICD-10-CM | POA: Diagnosis not present

## 2019-07-15 ENCOUNTER — Other Ambulatory Visit: Payer: Self-pay

## 2019-07-15 ENCOUNTER — Ambulatory Visit
Admission: RE | Admit: 2019-07-15 | Discharge: 2019-07-15 | Disposition: A | Discharge status: Home / Self Care | Payer: Medicare PPO | Source: Ambulatory Visit | Attending: Radiation Oncology | Admitting: Radiation Oncology

## 2019-07-15 DIAGNOSIS — Z510 Encounter for antineoplastic radiation therapy: Secondary | ICD-10-CM | POA: Diagnosis not present

## 2019-07-18 ENCOUNTER — Ambulatory Visit
Admission: RE | Admit: 2019-07-18 | Discharge: 2019-07-18 | Disposition: A | Discharge status: Home / Self Care | Payer: Medicare PPO | Source: Ambulatory Visit | Attending: Radiation Oncology | Admitting: Radiation Oncology

## 2019-07-18 ENCOUNTER — Other Ambulatory Visit: Payer: Self-pay

## 2019-07-18 ENCOUNTER — Inpatient Hospital Stay: Payer: Medicare PPO | Admitting: Internal Medicine

## 2019-07-18 ENCOUNTER — Inpatient Hospital Stay: Payer: Medicare PPO | Attending: Internal Medicine

## 2019-07-18 VITALS — BP 132/75 | HR 69 | Temp 98.2°F | Resp 17 | Ht 61.0 in | Wt 148.5 lb

## 2019-07-18 DIAGNOSIS — Z79899 Other long term (current) drug therapy: Secondary | ICD-10-CM | POA: Insufficient documentation

## 2019-07-18 DIAGNOSIS — C711 Malignant neoplasm of frontal lobe: Secondary | ICD-10-CM

## 2019-07-18 DIAGNOSIS — Z510 Encounter for antineoplastic radiation therapy: Secondary | ICD-10-CM | POA: Diagnosis not present

## 2019-07-18 DIAGNOSIS — Z923 Personal history of irradiation: Secondary | ICD-10-CM | POA: Insufficient documentation

## 2019-07-18 LAB — CBC WITH DIFFERENTIAL (CANCER CENTER ONLY)
Abs Immature Granulocytes: 0.06 10*3/uL (ref 0.00–0.07)
Basophils Absolute: 0.1 10*3/uL (ref 0.0–0.1)
Basophils Relative: 1 %
Eosinophils Absolute: 0.1 10*3/uL (ref 0.0–0.5)
Eosinophils Relative: 1 %
HCT: 37.2 % (ref 36.0–46.0)
Hemoglobin: 12 g/dL (ref 12.0–15.0)
Immature Granulocytes: 1 %
Lymphocytes Relative: 11 %
Lymphs Abs: 0.9 10*3/uL (ref 0.7–4.0)
MCH: 29.3 pg (ref 26.0–34.0)
MCHC: 32.3 g/dL (ref 30.0–36.0)
MCV: 90.7 fL (ref 80.0–100.0)
Monocytes Absolute: 0.5 10*3/uL (ref 0.1–1.0)
Monocytes Relative: 6 %
Neutro Abs: 6.5 10*3/uL (ref 1.7–7.7)
Neutrophils Relative %: 80 %
Platelet Count: 296 10*3/uL (ref 150–400)
RBC: 4.1 MIL/uL (ref 3.87–5.11)
RDW: 14.6 % (ref 11.5–15.5)
WBC Count: 8.1 10*3/uL (ref 4.0–10.5)
nRBC: 0 % (ref 0.0–0.2)

## 2019-07-18 LAB — CMP (CANCER CENTER ONLY)
ALT: 22 U/L (ref 0–44)
AST: 24 U/L (ref 15–41)
Albumin: 3.7 g/dL (ref 3.5–5.0)
Alkaline Phosphatase: 51 U/L (ref 38–126)
Anion gap: 9 (ref 5–15)
BUN: 13 mg/dL (ref 8–23)
CO2: 25 mmol/L (ref 22–32)
Calcium: 9.5 mg/dL (ref 8.9–10.3)
Chloride: 109 mmol/L (ref 98–111)
Creatinine: 0.76 mg/dL (ref 0.44–1.00)
GFR, Est AFR Am: >60 mL/min (ref >60)
GFR, Estimated: >60 mL/min (ref >60)
Glucose, Bld: 89 mg/dL (ref 70–99)
Potassium: 4.3 mmol/L (ref 3.5–5.1)
Sodium: 143 mmol/L (ref 135–145)
Total Bilirubin: 0.4 mg/dL (ref 0.3–1.2)
Total Protein: 6.3 g/dL — ABNORMAL LOW (ref 6.5–8.1)

## 2019-07-18 NOTE — Progress Notes (Signed)
[facility] at [place], [place] [address] [phone]   Interval Evaluation  Date of Service: 07/18/19 Patient Name: Dorothy Mason Patient MRN: [id number] Patient DOB: [birth year] Provider: Ventura Sellers, MD  Identifying Statement:  Dorothy Mason is a 69 y.o. female with left frontal glioblastoma   Oncologic History: Oncology History  Glioblastoma multiforme of frontal lobe ([place])  05/16/2019 Surgery   Craniot[place]   06/24/2019 -  Chemotherapy   The patient had [No matching medication found in this treatment plan]  for chemotherapy treatment.      Biomarkers:  MGMT Unknown.  IDH 1/2 Wild type.  EGFR Unknown  TERT Unknown   Interval History:  JAWANNA DYKMAN presents today for follow up, now having completed 2 weeks of radiation and Temodar.  No further issues with chemo since obtaining and utilizing zofran prior to dosing.  She has experienced mild fatigue from therapy.  Decreased Keppra to 241m twice per day, well tolerated.  No further steroids.  Denies seizures or headaches.  H+P (06/20/19) Patient initially presented to medical attention in late December with several weeks of progressive word finding difficuly, confusion, and gait impairment.  She obtained CNS imaging locally in [place] at request of her PCP which demonstrated enhancing frontal masses consistent with primary brain tumor.  She then [place] crani[place] of the [place].  This was followed by several weeks of inpatient rehabilitation, where she was discharged with independent gait and otherwise fully intact.  She does acknowledge some difficulty with communication, and "slower" pace of activities such as dressing in the morning.  She denies any seizures or headaches, and has fully weaned off dexamethasone.      Medications: Current Outpatient Medications on File Prior to Visit   Medication Sig Dispense Refill  . amLODipine (NORVASC) 5 MG tablet Take 5 mg by mouth daily.    .Marland Kitchenaspirin 325 MG tablet Take 325 mg by mouth daily.    . Cholecalciferol (VITAMIN D3) 2000 UNITS capsule Take 2,000 Units by mouth daily.      .Marland KitchenlevETIRAcetam (KEPPRA) 500 MG tablet Take 1 tablet (500 mg total) by mouth 2 (two) times daily.    .Marland KitchenLORazepam (ATIVAN) 0.5 MG tablet 1 tablet po 30 minutes prior to radiation or MRI 40 tablet 0  . ondansetron (ZOFRAN) 8 MG tablet Take 1 tablet (8 mg total) by mouth 2 (two) times daily as needed (nausea and vomiting). May take 30-60 minutes prior to Temodar administration if nausea/vomiting occurs. 30 tablet 1  . temozolomide (TEMODAR) 100 MG capsule Take 1 capsule (100 mg total) by mouth daily. May take on an empty stomach to decrease nausea & vomiting. 42 capsule 0  . temozolomide (TEMODAR) 20 MG capsule Take 1 capsule (20 mg total) by mouth daily. May take on an empty stomach to decrease nausea & vomiting. 42 capsule 0   No current facility-administered medications on file prior to visit.    Allergies:  Allergies  Allergen Reactions  . Amoxicillin    Past Medical History: No past medical history on file. Past Surgical History: none  Social History:  Social History   Socioeconomic History  . Marital status: Single    Spouse name: Not on file  . Number of children: Not on file  . Years of education: Not on file  . Highest education level: Not on file  Occupational History  .  Not on file  Tobacco Use  . Smoking status: Unknown If Ever Smoked  . Smokeless tobacco: Never Used  . Tobacco comment: no smoking   Substance and Sexual Activity  . Alcohol use: No  . Drug use: Not on file  . Sexual activity: Not on file  Other Topics Concern  . 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    Social Determinants of Health   Financial Resource Strain:   . Difficulty of Paying Living Expenses: Not on  file  Food Insecurity:   . Worried About Charity fundraiser in the Last Year: Not on file  . Ran Out of Food in the Last Year: Not on file  Transportation Needs:   . Lack of Transportation (Medical): Not on file  . Lack of Transportation (Non-Medical): Not on file  Physical Activity:   . Days of Exercise per Week: Not on file  . Minutes of Exercise per Session: Not on file  Stress:   . Feeling of Stress : Not on file  Social Connections:   . Frequency of Communication with Friends and Family: Not on file  . Frequency of Social Gatherings with Friends and Family: Not on file  . Attends Religious Services: Not on file  . Active Member of Clubs or Organizations: Not on file  . Attends Archivist Meetings: Not on file  . Marital Status: Not on file  Intimate Partner Violence:   . Fear of Current or Ex-Partner: Not on file  . Emotionally Abused: Not on file  . Physically Abused: Not on file  . Sexually Abused: Not on file   Family History:  Family History  Problem Relation Age of Onset  . Stroke Other        grandmother  . Heart attack Other        grandfather - 29 and CHF     Review of Systems: Constitutional: Doesn't report fevers, chills or abnormal weight loss Eyes: Doesn't report blurriness of vision Ears, nose, mouth, throat, and face: Doesn't report sore throat Respiratory: Doesn't report cough, dyspnea or wheezes Cardiovascular: Doesn't report palpitation, chest discomfort  Gastrointestinal:  Doesn't report nausea, constipation, diarrhea GU: Doesn't report incontinence Skin: Doesn't report skin rashes Neurological: Per HPI Musculoskeletal: Doesn't report joint pain Behavioral/Psych: Doesn't report anxiety  Physical Exam: Vitals:   07/18/19 1021  BP: 132/75  Pulse: 69  Resp: 17  Temp: 98.2 F (36.8 C)  SpO2: 100%   KPS: 80. General: Alert, cooperative, pleasant, in no acute distress Head: Craniotomy scar EENT: No conjunctival injection or  scleral icterus.  Lungs: Resp effort normal Cardiac: Regular rate Abdomen: Non-distended abdomen Skin: No rashes cyanosis or petechiae. Extremities: No clubbing or edema  Neurologic Exam: Mental Status: Awake, alert, attentive to examiner. Oriented to self and environment. Language has some impairment with fluency and comprehension.  Age advanced psychomotor slowing.  Cranial Nerves: Visual acuity is grossly normal. Visual fields are full. Extra-ocular movements intact. No ptosis. Face is symmetric Motor: Tone and bulk are normal. Power is full in both arms and legs. Reflexes are symmetric, no pathologic reflexes present.  Sensory: Intact to light touch Gait: Normal, deferred tandem.   Labs: I have reviewed the data as listed    Component Value Date/Time   NA 142 06/23/2010 0053   K 3.2 (L) 06/23/2010 0053   CL 104 06/23/2010 0053   GLUCOSE 102 (H) 06/23/2010 0053   BUN  13 06/23/2010 0053   CREATININE 0.9 06/23/2010 0053   Lab Results  Component Value Date   WBC 8.1 07/18/2019   NEUTROABS 6.5 07/18/2019   HGB 12.0 07/18/2019   HCT 37.2 07/18/2019   MCV 90.7 07/18/2019   PLT 296 07/18/2019    Pre-op MRI:   Post-op MRI:      Assessment/Plan Glioblastoma multiforme of frontal lobe (H[facility])   Hanley Hays is clinically stable today, now s/p 2 weeks of IMRT and concurrent Temodar.  We recommended continuing with 42-day intensity-modulated radiation therapy with concurrent Temozolomide, dosed at 23m/m2 daily.  We reviewed side effecits of Temozolomide including fatigue, cytopenias, nausea/vomting, constipation.  Chemotherapy should be held for the following:  ANC less than 1,000  Platelets less than 100,000  LFT or creatinine greater than 2x ULN  If clinical concerns/contraindications develop  She should return to clinic during weeks 4 and 6 of IMRT with labs for evaluation.  May continue Keppra 2546mBID.  All questions were answered. The patient knows to  call the clinic with any problems, questions or concerns. No barriers to learning were detected.  The total time spent in the encounter was 30 minutes and more than 50% was on counseling and review of test results   ZaVentura SellersMD Medical Director of Neuro-Oncology [facility] [place]3/08/21 10:24 AM

## 2019-07-19 ENCOUNTER — Ambulatory Visit
Admission: RE | Admit: 2019-07-19 | Discharge: 2019-07-19 | Disposition: A | Discharge status: Home / Self Care | Payer: Medicare PPO | Source: Ambulatory Visit | Attending: Radiation Oncology | Admitting: Radiation Oncology

## 2019-07-19 ENCOUNTER — Other Ambulatory Visit: Payer: Self-pay

## 2019-07-19 DIAGNOSIS — Z510 Encounter for antineoplastic radiation therapy: Secondary | ICD-10-CM | POA: Diagnosis not present

## 2019-07-20 ENCOUNTER — Other Ambulatory Visit: Payer: Self-pay

## 2019-07-20 ENCOUNTER — Ambulatory Visit
Admission: RE | Admit: 2019-07-20 | Discharge: 2019-07-20 | Disposition: A | Discharge status: Home / Self Care | Payer: Medicare PPO | Source: Ambulatory Visit | Attending: Radiation Oncology | Admitting: Radiation Oncology

## 2019-07-20 DIAGNOSIS — Z510 Encounter for antineoplastic radiation therapy: Secondary | ICD-10-CM | POA: Diagnosis not present

## 2019-07-20 NOTE — Addendum Note (Signed)
Encounter addended by: Kyung Rudd, MD on: 07/20/2019 10:58 AM  Actions taken: Visit diagnoses modified, Clinical Note Signed

## 2019-07-20 NOTE — Progress Notes (Signed)
  Radiation Oncology         [phone]) [phone] ________________________________  Name: Dorothy Mason MRN: [id number]  Date: 06/23/2019  DOB: 07-Jun-1950  SIMULATION AND TREATMENT PLANNING NOTE  DIAGNOSIS:     ICD-10-CM   1. Glioblastoma multiforme of frontal lobe ([place])  C71.1      Site:  brain  NARRATIVE:  The patient was brought to the [facility].  Identity was confirmed.  All relevant records and images related to the planned course of therapy were reviewed.   Written consent to proceed with treatment was confirmed which was freely given after reviewing the details related to the planned course of therapy had been reviewed with the patient.  Then, the patient was set-up in a stable reproducible  supine position for radiation therapy.  CT images were obtained.  Surface markings were placed.    Medically necessary complex treatment device(s) for immobilization:  customized thermoplastic mask/ headcast.   The CT images were loaded into the planning software.  Then the target and avoidance structures were contoured.  Treatment planning then occurred.  The radiation prescription was entered and confirmed.    This is based on the patient's tomotherapy sinogram.  Each of these customized fields/ complex treatment devices will be used on a daily basis during the radiation course. I have requested : IMRT.  This is medically necessary due to the close proximity of the target region to critical normal brain structures.  I have requested a DVH of the following structures: PTV, brain, brainstem, optic chiasm, optic nerves.  The patient will undergo daily image guidance to ensure accurate localization of the target, and adequate minimize dose to the normal surrounding structures in close proximity to the target.   PLAN:  The patient will receive 46 Gy in 23 fractions initially. The patient will then receive a 14 Gy boost for a total dose of 60 Gy.   Special treatment procedure The  patient will also receive concurrent chemotherapy during the treatment. The patient may therefore experience increased toxicity or side effects and the patient will be monitored for such problems. This may require extra lab work as necessary. This therefore constitutes a special treatment procedure.   ________________________________   Jodelle Gross, MD, PhD

## 2019-07-21 ENCOUNTER — Other Ambulatory Visit: Payer: Self-pay

## 2019-07-21 ENCOUNTER — Ambulatory Visit
Admission: RE | Admit: 2019-07-21 | Discharge: 2019-07-21 | Disposition: A | Discharge status: Home / Self Care | Payer: Medicare PPO | Source: Ambulatory Visit | Attending: Radiation Oncology | Admitting: Radiation Oncology

## 2019-07-21 DIAGNOSIS — Z510 Encounter for antineoplastic radiation therapy: Secondary | ICD-10-CM | POA: Diagnosis not present

## 2019-07-22 ENCOUNTER — Other Ambulatory Visit: Payer: Self-pay

## 2019-07-22 ENCOUNTER — Ambulatory Visit
Admission: RE | Admit: 2019-07-22 | Discharge: 2019-07-22 | Disposition: A | Discharge status: Home / Self Care | Payer: Medicare PPO | Source: Ambulatory Visit | Attending: Radiation Oncology | Admitting: Radiation Oncology

## 2019-07-22 DIAGNOSIS — Z510 Encounter for antineoplastic radiation therapy: Secondary | ICD-10-CM | POA: Diagnosis not present

## 2019-07-25 ENCOUNTER — Ambulatory Visit
Admission: RE | Admit: 2019-07-25 | Discharge: 2019-07-25 | Disposition: A | Discharge status: Home / Self Care | Payer: Medicare PPO | Source: Ambulatory Visit | Attending: Radiation Oncology | Admitting: Radiation Oncology

## 2019-07-25 ENCOUNTER — Other Ambulatory Visit: Payer: Self-pay

## 2019-07-25 DIAGNOSIS — Z510 Encounter for antineoplastic radiation therapy: Secondary | ICD-10-CM | POA: Diagnosis not present

## 2019-07-26 ENCOUNTER — Ambulatory Visit
Admission: RE | Admit: 2019-07-26 | Discharge: 2019-07-26 | Disposition: A | Discharge status: Home / Self Care | Payer: Medicare PPO | Source: Ambulatory Visit | Attending: Radiation Oncology | Admitting: Radiation Oncology

## 2019-07-26 ENCOUNTER — Other Ambulatory Visit: Payer: Self-pay

## 2019-07-26 DIAGNOSIS — Z510 Encounter for antineoplastic radiation therapy: Secondary | ICD-10-CM | POA: Diagnosis not present

## 2019-07-27 ENCOUNTER — Telehealth: Payer: Self-pay

## 2019-07-27 ENCOUNTER — Ambulatory Visit
Admission: RE | Admit: 2019-07-27 | Discharge: 2019-07-27 | Disposition: A | Discharge status: Home / Self Care | Payer: Medicare PPO | Source: Ambulatory Visit | Attending: Radiation Oncology | Admitting: Radiation Oncology

## 2019-07-27 ENCOUNTER — Other Ambulatory Visit: Payer: Self-pay

## 2019-07-27 DIAGNOSIS — Z510 Encounter for antineoplastic radiation therapy: Secondary | ICD-10-CM | POA: Diagnosis not present

## 2019-07-27 MED FILL — TEMOZOLOMIDE 100 MG CAPS: 100 | 14 days supply | Qty: 14 | Fill #1

## 2019-07-27 MED FILL — ONDANSETRON HCL 8 MG TABLET: 8 | 15 days supply | Qty: 30 | Fill #1

## 2019-07-27 MED FILL — TEMOZOLOMIDE 20 MG CAPS: 20 | 14 days supply | Qty: 14 | Fill #1

## 2019-07-27 NOTE — Telephone Encounter (Signed)
Received call from Pt's husband requesting Keppra refill. He would like to know if there is an alternative anti-seizure medication that MD could prescribe instead of the Keppra as her dry cough is not resolving. However, if this is not possible then the [place] can be refilled.  Request routed to MD.

## 2019-07-28 ENCOUNTER — Ambulatory Visit
Admission: RE | Admit: 2019-07-28 | Discharge: 2019-07-28 | Disposition: A | Discharge status: Home / Self Care | Payer: Medicare PPO | Source: Ambulatory Visit | Attending: Radiation Oncology | Admitting: Radiation Oncology

## 2019-07-28 ENCOUNTER — Other Ambulatory Visit: Payer: Self-pay | Admitting: Internal Medicine

## 2019-07-28 ENCOUNTER — Other Ambulatory Visit: Payer: Self-pay

## 2019-07-28 DIAGNOSIS — Z510 Encounter for antineoplastic radiation therapy: Secondary | ICD-10-CM | POA: Diagnosis not present

## 2019-07-28 MED ORDER — LEVETIRACETAM 250 MG PO TABS: 250.0000 mg | ORAL_TABLET | Freq: Two times a day (BID) | ORAL | 3 refills | Status: DC

## 2019-07-29 ENCOUNTER — Ambulatory Visit
Admission: RE | Admit: 2019-07-29 | Discharge: 2019-07-29 | Disposition: A | Discharge status: Home / Self Care | Payer: Medicare PPO | Source: Ambulatory Visit | Attending: Radiation Oncology | Admitting: Radiation Oncology

## 2019-07-29 ENCOUNTER — Other Ambulatory Visit: Payer: Self-pay

## 2019-07-29 DIAGNOSIS — Z510 Encounter for antineoplastic radiation therapy: Secondary | ICD-10-CM | POA: Diagnosis not present

## 2019-08-01 ENCOUNTER — Other Ambulatory Visit: Payer: Self-pay

## 2019-08-01 ENCOUNTER — Ambulatory Visit
Admission: RE | Admit: 2019-08-01 | Discharge: 2019-08-01 | Disposition: A | Discharge status: Home / Self Care | Payer: Medicare PPO | Source: Ambulatory Visit | Attending: Radiation Oncology | Admitting: Radiation Oncology

## 2019-08-01 ENCOUNTER — Inpatient Hospital Stay: Payer: Medicare PPO

## 2019-08-01 ENCOUNTER — Inpatient Hospital Stay (HOSPITAL_BASED_OUTPATIENT_CLINIC_OR_DEPARTMENT_OTHER): Payer: Medicare PPO | Admitting: Internal Medicine

## 2019-08-01 VITALS — BP 141/85 | HR 66 | Temp 98.0°F | Resp 18 | Ht 61.0 in | Wt 148.5 lb

## 2019-08-01 DIAGNOSIS — C711 Malignant neoplasm of frontal lobe: Secondary | ICD-10-CM

## 2019-08-01 DIAGNOSIS — Z510 Encounter for antineoplastic radiation therapy: Secondary | ICD-10-CM | POA: Diagnosis not present

## 2019-08-01 LAB — CMP (CANCER CENTER ONLY)
ALT: 46 U/L — ABNORMAL HIGH (ref 0–44)
AST: 34 U/L (ref 15–41)
Albumin: 3.9 g/dL (ref 3.5–5.0)
Alkaline Phosphatase: 49 U/L (ref 38–126)
Anion gap: 11 (ref 5–15)
BUN: 13 mg/dL (ref 8–23)
CO2: 24 mmol/L (ref 22–32)
Calcium: 9.3 mg/dL (ref 8.9–10.3)
Chloride: 109 mmol/L (ref 98–111)
Creatinine: 0.74 mg/dL (ref 0.44–1.00)
GFR, Est AFR Am: >60 mL/min (ref >60)
GFR, Estimated: >60 mL/min (ref >60)
Glucose, Bld: 88 mg/dL (ref 70–99)
Potassium: 3.3 mmol/L — ABNORMAL LOW (ref 3.5–5.1)
Sodium: 144 mmol/L (ref 135–145)
Total Bilirubin: 0.6 mg/dL (ref 0.3–1.2)
Total Protein: 6.3 g/dL — ABNORMAL LOW (ref 6.5–8.1)

## 2019-08-01 LAB — CBC WITH DIFFERENTIAL (CANCER CENTER ONLY)
Abs Immature Granulocytes: 0.02 10*3/uL (ref 0.00–0.07)
Basophils Absolute: 0 10*3/uL (ref 0.0–0.1)
Basophils Relative: 1 %
Eosinophils Absolute: 0.2 10*3/uL (ref 0.0–0.5)
Eosinophils Relative: 3 %
HCT: 41.9 % (ref 36.0–46.0)
Hemoglobin: 13 g/dL (ref 12.0–15.0)
Immature Granulocytes: 0 %
Lymphocytes Relative: 16 %
Lymphs Abs: 1.1 10*3/uL (ref 0.7–4.0)
MCH: 29.2 pg (ref 26.0–34.0)
MCHC: 31 g/dL (ref 30.0–36.0)
MCV: 94.2 fL (ref 80.0–100.0)
Monocytes Absolute: 0.5 10*3/uL (ref 0.1–1.0)
Monocytes Relative: 7 %
Neutro Abs: 4.7 10*3/uL (ref 1.7–7.7)
Neutrophils Relative %: 73 %
Platelet Count: 291 10*3/uL (ref 150–400)
RBC: 4.45 MIL/uL (ref 3.87–5.11)
RDW: 14.6 % (ref 11.5–15.5)
WBC Count: 6.5 10*3/uL (ref 4.0–10.5)
nRBC: 0 % (ref 0.0–0.2)

## 2019-08-01 NOTE — Progress Notes (Signed)
[facility] at [address] [place], [place] [address] [phone]   Interval Evaluation  Date of Service: 08/01/19 Patient Name: Dorothy Mason Patient MRN: [id number] Patient DOB: [birth year] Provider: Ventura Sellers, MD  Identifying Statement:  Dorothy Mason is a 69 y.o. female with left frontal glioblastoma   Oncologic History: Oncology History  Glioblastoma multiforme of frontal lobe ([place])  05/16/2019 Surgery   Craniot[place]   07/04/2019 -  Radiation Therapy   IMRT with concurrent Temozolomide 54m/m2     Biomarkers:  MGMT Unknown.  IDH 1/2 Wild type.  EGFR Unknown  TERT Unknown   Interval History:  Dorothy WHITMOREpresents today for follow up, now having completed 4 weeks of radiation and Temodar.  No further issues with chemo or zofran.  She has experienced mild fatigue from therapy.  Con't Keppra to 25105mtwice per day, well tolerated.  No further steroids.  Denies seizures or headaches.  H+P (06/20/19) Patient initially presented to medical attention in late December with several weeks of progressive word finding difficuly, confusion, and gait impairment.  [place] which demonstrated enhancing frontal masses consistent with primary brain tumor.  [place].  This was followed by several weeks of inpatient rehabilitation, where she was discharged with independent gait and otherwise fully intact.  She does acknowledge some difficulty with communication, and "slower" pace of activities such as dressing in the morning.  She denies any seizures or headaches, and has fully weaned off dexamethasone.      Medications: Current Outpatient Medications on File Prior to Visit  Medication Sig Dispense Refill  . amLODipine (NORVASC) 5 MG tablet Take 5 mg by mouth daily.    . Marland Kitchenaspirin 325 MG tablet Take 325 mg by mouth daily.    . Cholecalciferol (VITAMIN D3) 2000 UNITS capsule Take 2,000 Units by mouth daily.      . Marland KitchenevETIRAcetam (KEPPRA) 250 MG tablet Take 1 tablet (250 mg total) by mouth 2 (two) times daily. 60 tablet 3  . LORazepam (ATIVAN) 0.5 MG tablet 1 tablet po 30 minutes prior to radiation or MRI 40 tablet 0  . losartan (COZAAR) 50 MG tablet     . ondansetron (ZOFRAN) 8 MG tablet Take 1 tablet (8 mg total) by mouth 2 (two) times daily as needed (nausea and vomiting). May take 30-60 minutes prior to Temodar administration if nausea/vomiting occurs. 30 tablet 1  . temozolomide (TEMODAR) 100 MG capsule Take 1 capsule (100 mg total) by mouth daily. May take on an empty stomach to decrease nausea & vomiting. 42 capsule 0  . temozolomide (TEMODAR) 20 MG capsule Take 1 capsule (20 mg total) by mouth daily. May take on an empty stomach to decrease nausea & vomiting. 42 capsule 0   No current facility-administered medications on file prior to visit.    Allergies:  Allergies  Allergen Reactions  . Amoxicillin    Past Medical History: No past medical history on file. Past Surgical History: none  Social History:  Social History   Socioeconomic History  . Marital status: Married    Spouse name: Not on file  . Number of children: Not on file  . Years of education: Not on file  . Highest education level: Not on file  Occupational History  . Not on file  Tobacco Use  . Smoking status: Unknown If Ever Smoked  . Smokeless tobacco: Never Used  . Tobacco comment: no smoking   Substance and Sexual Activity  . Alcohol use: No  . Drug use: Not on file  . Sexual activity: Not on file  Other Topics Concern  . 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    Social Determinants of Health   Financial Resource Strain:   . Difficulty of Paying Living Expenses:   Food Insecurity:   . Worried About Charity fundraiser in  the Last Year:   . Arboriculturist in the Last Year:   Transportation Needs:   . Film/video editor (Medical):   Marland Kitchen Lack of Transportation (Non-Medical):   Physical Activity:   . Days of Exercise per Week:   . Minutes of Exercise per Session:   Stress:   . Feeling of Stress :   Social Connections:   . Frequency of Communication with Friends and Family:   . Frequency of Social Gatherings with Friends and Family:   . Attends Religious Services:   . Active Member of Clubs or Organizations:   . Attends Archivist Meetings:   Marland Kitchen Marital Status:   Intimate Partner Violence:   . Fear of Current or Ex-Partner:   . Emotionally Abused:   Marland Kitchen Physically Abused:   . Sexually Abused:    Family History:  Family History  Problem Relation Age of Onset  . Stroke Other        grandmother  . Heart attack Other        grandfather - 69 and CHF     Review of Systems: Constitutional: Doesn't report fevers, chills or abnormal weight loss Eyes: Doesn't report blurriness of vision Ears, nose, mouth, throat, and face: Doesn't report sore throat Respiratory: Doesn't report cough, dyspnea or wheezes Cardiovascular: Doesn't report palpitation, chest discomfort  Gastrointestinal:  Doesn't report nausea, constipation, diarrhea GU: Doesn't report incontinence Skin: Doesn't report skin rashes Neurological: Per HPI Musculoskeletal: Doesn't report joint pain Behavioral/Psych: Doesn't report anxiety  Physical Exam: Vitals:   08/01/19 1226  BP: (!) 141/85  Pulse: 66  Resp: 18  Temp: 98 F (36.7 C)  SpO2: 99%   KPS: 80. General: Alert, cooperative, pleasant, in no acute distress Head: Craniotomy scar EENT: No conjunctival injection or scleral icterus.  Lungs: Resp effort normal Cardiac: Regular rate Abdomen: Non-distended abdomen Skin: No rashes cyanosis or petechiae. Extremities: No clubbing or edema  Neurologic Exam: Mental Status: Awake, alert, attentive to examiner. Oriented  to self and environment. Language has some impairment with fluency and comprehension.  Age advanced psychomotor slowing.  Cranial Nerves: Visual acuity is grossly normal. Visual fields are full. Extra-ocular movements intact. No ptosis. Face is symmetric Motor: Tone and bulk are normal. Power is full in both arms and legs. Reflexes are symmetric, no pathologic reflexes present.  Sensory: Intact to light touch Gait: Normal, deferred tandem.   Labs: I have reviewed the data as listed    Component Value Date/Time   NA 144 08/01/2019 1108   K 3.3 (L) 08/01/2019 1108   CL 109 08/01/2019 1108   CO2 24 08/01/2019 1108   GLUCOSE 88 08/01/2019 1108   BUN 13 08/01/2019 1108   CREATININE 0.74 08/01/2019 1108   CALCIUM 9.3 08/01/2019 1108   PROT 6.3 (L) 08/01/2019 1108   ALBUMIN 3.9 08/01/2019 1108   AST 34 08/01/2019 1108  ALT 46 (H) 08/01/2019 1108   ALKPHOS 49 08/01/2019 1108   BILITOT 0.6 08/01/2019 1108   GFRNONAA >60 08/01/2019 1108   GFRAA >60 08/01/2019 1108   Lab Results  Component Value Date   WBC 6.5 08/01/2019   NEUTROABS 4.7 08/01/2019   HGB 13.0 08/01/2019   HCT 41.9 08/01/2019   MCV 94.2 08/01/2019   PLT 291 08/01/2019    Pre-op MRI:   Post-op MRI:      Assessment/Plan Glioblastoma multiforme of frontal lobe (HCC)   Dorothy Mason is clinically stable today, now s/p 4 weeks of IMRT and concurrent Temodar.  We recommended continuing with 42-day intensity-modulated radiation therapy with concurrent Temozolomide, dosed at 85m/m2 daily.  We reviewed side effecits of Temozolomide including fatigue, cytopenias, nausea/vomting, constipation.  Chemotherapy should be held for the following:  ANC less than 1,000  Platelets less than 100,000  LFT or creatinine greater than 2x ULN  If clinical concerns/contraindications develop  She should return to clinic during weeks 6 of IMRT with labs for evaluation.  May continue Keppra 2552mBID.  All questions were  answered. The patient knows to call the clinic with any problems, questions or concerns. No barriers to learning were detected.  The total time spent in the encounter was 30 minutes and more than 50% was on counseling and review of test results   ZaVentura SellersMD Medical Director of Neuro-Oncology [facility]3/22/21 12:12 PM

## 2019-08-02 ENCOUNTER — Ambulatory Visit
Admission: RE | Admit: 2019-08-02 | Discharge: 2019-08-02 | Disposition: A | Discharge status: Home / Self Care | Payer: Medicare PPO | Source: Ambulatory Visit | Attending: Radiation Oncology | Admitting: Radiation Oncology

## 2019-08-02 ENCOUNTER — Other Ambulatory Visit: Payer: Self-pay

## 2019-08-02 DIAGNOSIS — Z510 Encounter for antineoplastic radiation therapy: Secondary | ICD-10-CM | POA: Diagnosis not present

## 2019-08-03 ENCOUNTER — Ambulatory Visit
Admission: RE | Admit: 2019-08-03 | Discharge: 2019-08-03 | Disposition: A | Discharge status: Home / Self Care | Payer: Medicare PPO | Source: Ambulatory Visit | Attending: Radiation Oncology | Admitting: Radiation Oncology

## 2019-08-03 ENCOUNTER — Other Ambulatory Visit: Payer: Self-pay

## 2019-08-03 DIAGNOSIS — Z510 Encounter for antineoplastic radiation therapy: Secondary | ICD-10-CM | POA: Diagnosis not present

## 2019-08-04 ENCOUNTER — Other Ambulatory Visit: Payer: Self-pay

## 2019-08-04 ENCOUNTER — Ambulatory Visit
Admission: RE | Admit: 2019-08-04 | Discharge: 2019-08-04 | Disposition: A | Discharge status: Home / Self Care | Payer: Medicare PPO | Source: Ambulatory Visit | Attending: Radiation Oncology | Admitting: Radiation Oncology

## 2019-08-04 DIAGNOSIS — Z510 Encounter for antineoplastic radiation therapy: Secondary | ICD-10-CM | POA: Diagnosis not present

## 2019-08-05 ENCOUNTER — Ambulatory Visit
Admission: RE | Admit: 2019-08-05 | Discharge: 2019-08-05 | Disposition: A | Discharge status: Home / Self Care | Payer: Medicare PPO | Source: Ambulatory Visit | Attending: Radiation Oncology | Admitting: Radiation Oncology

## 2019-08-05 ENCOUNTER — Other Ambulatory Visit: Payer: Self-pay

## 2019-08-05 DIAGNOSIS — Z510 Encounter for antineoplastic radiation therapy: Secondary | ICD-10-CM | POA: Diagnosis not present

## 2019-08-08 ENCOUNTER — Ambulatory Visit
Admission: RE | Admit: 2019-08-08 | Discharge: 2019-08-08 | Disposition: A | Discharge status: Home / Self Care | Payer: Medicare PPO | Source: Ambulatory Visit | Attending: Radiation Oncology | Admitting: Radiation Oncology

## 2019-08-08 ENCOUNTER — Other Ambulatory Visit: Payer: Self-pay

## 2019-08-08 DIAGNOSIS — Z510 Encounter for antineoplastic radiation therapy: Secondary | ICD-10-CM | POA: Diagnosis not present

## 2019-08-09 ENCOUNTER — Other Ambulatory Visit: Payer: Self-pay

## 2019-08-09 ENCOUNTER — Ambulatory Visit
Admission: RE | Admit: 2019-08-09 | Discharge: 2019-08-09 | Disposition: A | Discharge status: Home / Self Care | Payer: Medicare PPO | Source: Ambulatory Visit | Attending: Radiation Oncology | Admitting: Radiation Oncology

## 2019-08-09 DIAGNOSIS — Z510 Encounter for antineoplastic radiation therapy: Secondary | ICD-10-CM | POA: Diagnosis not present

## 2019-08-10 ENCOUNTER — Other Ambulatory Visit: Payer: Self-pay

## 2019-08-10 ENCOUNTER — Ambulatory Visit
Admission: RE | Admit: 2019-08-10 | Discharge: 2019-08-10 | Disposition: A | Discharge status: Home / Self Care | Payer: Medicare PPO | Source: Ambulatory Visit | Attending: Radiation Oncology | Admitting: Radiation Oncology

## 2019-08-10 DIAGNOSIS — Z510 Encounter for antineoplastic radiation therapy: Secondary | ICD-10-CM | POA: Diagnosis not present

## 2019-08-11 ENCOUNTER — Other Ambulatory Visit: Payer: Self-pay

## 2019-08-11 ENCOUNTER — Ambulatory Visit
Admission: RE | Admit: 2019-08-11 | Discharge: 2019-08-11 | Disposition: A | Discharge status: Home / Self Care | Payer: Medicare PPO | Source: Ambulatory Visit | Attending: Radiation Oncology | Admitting: Radiation Oncology

## 2019-08-11 DIAGNOSIS — Z510 Encounter for antineoplastic radiation therapy: Secondary | ICD-10-CM | POA: Diagnosis not present

## 2019-08-11 DIAGNOSIS — C711 Malignant neoplasm of frontal lobe: Secondary | ICD-10-CM | POA: Diagnosis present

## 2019-08-12 ENCOUNTER — Ambulatory Visit
Admission: RE | Admit: 2019-08-12 | Discharge: 2019-08-12 | Disposition: A | Discharge status: Home / Self Care | Payer: Medicare PPO | Source: Ambulatory Visit | Attending: Radiation Oncology | Admitting: Radiation Oncology

## 2019-08-12 ENCOUNTER — Inpatient Hospital Stay: Payer: Medicare PPO | Attending: Internal Medicine

## 2019-08-12 ENCOUNTER — Encounter: Payer: Self-pay | Admitting: Radiation Oncology

## 2019-08-12 ENCOUNTER — Inpatient Hospital Stay (HOSPITAL_BASED_OUTPATIENT_CLINIC_OR_DEPARTMENT_OTHER): Payer: Medicare PPO | Admitting: Internal Medicine

## 2019-08-12 ENCOUNTER — Other Ambulatory Visit: Payer: Self-pay

## 2019-08-12 VITALS — BP 132/75 | HR 71 | Temp 98.0°F | Resp 18 | Ht 61.0 in | Wt 147.6 lb

## 2019-08-12 DIAGNOSIS — C711 Malignant neoplasm of frontal lobe: Secondary | ICD-10-CM

## 2019-08-12 DIAGNOSIS — Z79899 Other long term (current) drug therapy: Secondary | ICD-10-CM | POA: Diagnosis not present

## 2019-08-12 DIAGNOSIS — Z923 Personal history of irradiation: Secondary | ICD-10-CM | POA: Insufficient documentation

## 2019-08-12 DIAGNOSIS — Z510 Encounter for antineoplastic radiation therapy: Secondary | ICD-10-CM | POA: Diagnosis not present

## 2019-08-12 LAB — CMP (CANCER CENTER ONLY)
ALT: 23 U/L (ref 0–44)
AST: 22 U/L (ref 15–41)
Albumin: 3.9 g/dL (ref 3.5–5.0)
Alkaline Phosphatase: 46 U/L (ref 38–126)
Anion gap: 10 (ref 5–15)
BUN: 14 mg/dL (ref 8–23)
CO2: 26 mmol/L (ref 22–32)
Calcium: 9.5 mg/dL (ref 8.9–10.3)
Chloride: 109 mmol/L (ref 98–111)
Creatinine: 0.82 mg/dL (ref 0.44–1.00)
GFR, Est AFR Am: >60 mL/min (ref >60)
GFR, Estimated: >60 mL/min (ref >60)
Glucose, Bld: 95 mg/dL (ref 70–99)
Potassium: 3.8 mmol/L (ref 3.5–5.1)
Sodium: 145 mmol/L (ref 135–145)
Total Bilirubin: 0.6 mg/dL (ref 0.3–1.2)
Total Protein: 6.6 g/dL (ref 6.5–8.1)

## 2019-08-12 LAB — CBC WITH DIFFERENTIAL (CANCER CENTER ONLY)
Abs Immature Granulocytes: 0.01 10*3/uL (ref 0.00–0.07)
Basophils Absolute: 0.1 10*3/uL (ref 0.0–0.1)
Basophils Relative: 1 %
Eosinophils Absolute: 0.2 10*3/uL (ref 0.0–0.5)
Eosinophils Relative: 3 %
HCT: 39.7 % (ref 36.0–46.0)
Hemoglobin: 12.7 g/dL (ref 12.0–15.0)
Immature Granulocytes: 0 %
Lymphocytes Relative: 12 %
Lymphs Abs: 0.7 10*3/uL (ref 0.7–4.0)
MCH: 29.7 pg (ref 26.0–34.0)
MCHC: 32 g/dL (ref 30.0–36.0)
MCV: 92.8 fL (ref 80.0–100.0)
Monocytes Absolute: 0.5 10*3/uL (ref 0.1–1.0)
Monocytes Relative: 8 %
Neutro Abs: 4.4 10*3/uL (ref 1.7–7.7)
Neutrophils Relative %: 76 %
Platelet Count: 237 10*3/uL (ref 150–400)
RBC: 4.28 MIL/uL (ref 3.87–5.11)
RDW: 14.6 % (ref 11.5–15.5)
WBC Count: 5.9 10*3/uL (ref 4.0–10.5)
nRBC: 0 % (ref 0.0–0.2)

## 2019-08-12 NOTE — Progress Notes (Signed)
[facility] at [facility] [place], [place] [address] [phone]   Interval Evaluation  Date of Service: 08/12/19 Patient Name: Dorothy Mason Patient MRN: [id number] Patient DOB: May 16, 1950 Provider: Ventura Sellers, MD  Identifying Statement:  Dorothy Mason is a 69 y.o. female with left frontal glioblastoma   Oncologic History: Oncology History  Glioblastoma multiforme of frontal lobe ([facility])  05/16/2019 Surgery   Craniot[place]   07/04/2019 -  Radiation Therapy   IMRT with concurrent Temozolomide 36m/m2     Biomarkers:  MGMT Unknown.  IDH 1/2 Wild type.  EGFR Unknown  TERT Unknown   Interval History:  SMARYLIN LATHONpresents today for follow up, now in final week of radiation and Temodar.  No further issues with chemo or zofran.  She continues to xperienced mild fatigue from therapy.  Con't Keppra to 2568mtwice per day, well tolerated.  No further steroids.  Denies seizures or headaches.  H+P (06/20/19) Patient initially presented to medical attention in late December with several weeks of progressive word finding difficuly, confusion, and gait impairment.  [place] which demonstrated enhancing frontal masses consistent with primary brain tumor.  [place].  This was followed by several weeks of inpatient rehabilitation, where she was discharged with independent gait and otherwise fully intact.  She does acknowledge some difficulty with communication, and "slower" pace of activities such as dressing in the morning.  She denies any seizures or headaches, and has fully weaned off dexamethasone.      Medications: Current Outpatient Medications on File Prior to Visit  Medication Sig Dispense Refill  . amLODipine (NORVASC) 5 MG tablet Take 5 mg by mouth daily.    . Marland Kitchenaspirin 325 MG tablet Take 325 mg by mouth daily.    . Cholecalciferol (VITAMIN D3) 2000 UNITS capsule Take 2,000 Units by mouth daily.      . Marland KitchenevETIRAcetam (KEPPRA) 250 MG tablet Take 1 tablet (250 mg total) by mouth 2 (two) times daily. 60 tablet 3  . LORazepam (ATIVAN) 0.5 MG tablet 1 tablet po 30 minutes prior to radiation or MRI 40 tablet 0  . losartan (COZAAR) 50 MG tablet     . ondansetron (ZOFRAN) 8 MG tablet Take 1 tablet (8 mg total) by mouth 2 (two) times daily as needed (nausea and vomiting). May take 30-60 minutes prior to Temodar administration if nausea/vomiting occurs. 30 tablet 1  . temozolomide (TEMODAR) 100 MG capsule Take 1 capsule (100 mg total) by mouth daily. May take on an empty stomach to decrease nausea & vomiting. 42 capsule 0  . temozolomide (TEMODAR) 20 MG capsule Take 1 capsule (20 mg total) by mouth daily. May take on an empty stomach to decrease nausea & vomiting. 42 capsule 0   No current facility-administered medications on file prior to visit.    Allergies:  Allergies  Allergen Reactions  . Amoxicillin    Past Medical History: No past medical history on file. Past Surgical History: none  Social History:  Social History   Socioeconomic History  . Marital status: Married    Spouse name: Not on file  . Number of children: Not on file  . Years of education: Not on file  . Highest education level: Not on file  Occupational History  . Not on file  Tobacco Use  . Smoking status: Unknown If Ever Smoked  . Smokeless tobacco: Never Used  . Tobacco comment: no smoking   Substance and Sexual Activity  . Alcohol use: No  . Drug use: Not on file  . Sexual activity: Not on file  Other Topics Concern  . 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    Social Determinants of Health   Financial Resource Strain:   . Difficulty of Paying Living Expenses:   Food Insecurity:   . Worried About Charity fundraiser in  the Last Year:   . Arboriculturist in the Last Year:   Transportation Needs:   . Film/video editor (Medical):   Marland Kitchen Lack of Transportation (Non-Medical):   Physical Activity:   . Days of Exercise per Week:   . Minutes of Exercise per Session:   Stress:   . Feeling of Stress :   Social Connections:   . Frequency of Communication with Friends and Family:   . Frequency of Social Gatherings with Friends and Family:   . Attends Religious Services:   . Active Member of Clubs or Organizations:   . Attends Archivist Meetings:   Marland Kitchen Marital Status:   Intimate Partner Violence:   . Fear of Current or Ex-Partner:   . Emotionally Abused:   Marland Kitchen Physically Abused:   . Sexually Abused:    Family History:  Family History  Problem Relation Age of Onset  . Stroke Other        grandmother  . Heart attack Other        grandfather - 60 and CHF     Review of Systems: Constitutional: Doesn't report fevers, chills or abnormal weight loss Eyes: Doesn't report blurriness of vision Ears, nose, mouth, throat, and face: Doesn't report sore throat Respiratory: Doesn't report cough, dyspnea or wheezes Cardiovascular: Doesn't report palpitation, chest discomfort  Gastrointestinal:  Doesn't report nausea, constipation, diarrhea GU: Doesn't report incontinence Skin: Doesn't report skin rashes Neurological: Per HPI Musculoskeletal: Doesn't report joint pain Behavioral/Psych: Doesn't report anxiety  Physical Exam: Vitals:   08/12/19 1111  BP: 132/75  Pulse: 71  Resp: 18  Temp: 98 F (36.7 C)  SpO2: 98%   KPS: 80. General: Alert, cooperative, pleasant, in no acute distress Head: Craniotomy scar EENT: No conjunctival injection or scleral icterus.  Lungs: Resp effort normal Cardiac: Regular rate Abdomen: Non-distended abdomen Skin: No rashes cyanosis or petechiae. Extremities: No clubbing or edema  Neurologic Exam: Mental Status: Awake, alert, attentive to examiner. Oriented to  self and environment. Language has some impairment with fluency and comprehension.  Age advanced psychomotor slowing.  Cranial Nerves: Visual acuity is grossly normal. Visual fields are full. Extra-ocular movements intact. No ptosis. Face is symmetric Motor: Tone and bulk are normal. Power is full in both arms and legs. Reflexes are symmetric, no pathologic reflexes present.  Sensory: Intact to light touch Gait: Normal, deferred tandem.   Labs: I have reviewed the data as listed    Component Value Date/Time   NA 144 08/01/2019 1108   K 3.3 (L) 08/01/2019 1108   CL 109 08/01/2019 1108   CO2 24 08/01/2019 1108   GLUCOSE 88 08/01/2019 1108   BUN 13 08/01/2019 1108   CREATININE 0.74 08/01/2019 1108   CALCIUM 9.3 08/01/2019 1108   PROT 6.3 (L) 08/01/2019 1108   ALBUMIN 3.9 08/01/2019 1108   AST 34 08/01/2019 1108  ALT 46 (H) 08/01/2019 1108   ALKPHOS 49 08/01/2019 1108   BILITOT 0.6 08/01/2019 1108   GFRNONAA >60 08/01/2019 1108   GFRAA >60 08/01/2019 1108   Lab Results  Component Value Date   WBC 6.5 08/01/2019   NEUTROABS 4.7 08/01/2019   HGB 13.0 08/01/2019   HCT 41.9 08/01/2019   MCV 94.2 08/01/2019   PLT 291 08/01/2019    Pre-op MRI:   Post-op MRI:      Assessment/Plan Glioblastoma multiforme of frontal lobe ([facility])   CAROLA VIRAMONTES is clinically stable today, now s/p 6 weeks of IMRT and concurrent Temodar.  We recommended completing 42-day intensity-modulated radiation therapy with concurrent Temozolomide, dosed at 73m/m2 daily.  We reviewed side effecits of Temozolomide including fatigue, cytopenias, nausea/vomting, constipation.  Chemotherapy should be held for the following:  ANC less than 1,000  Platelets less than 100,000  LFT or creatinine greater than 2x ULN  If clinical concerns/contraindications develop  She should return to clinic in 4 weeks following post-RT brain MRI for evaluation.  May continue Keppra 2580mBID.  All questions were  answered. The patient knows to call the clinic with any problems, questions or concerns. No barriers to learning were detected.  The total time spent in the encounter was 30 minutes and more than 50% was on counseling and review of test results   ZaVentura SellersMD Medical Director of Neuro-Oncology [facility] [place]4/02/21 11:04 AM

## 2019-08-16 ENCOUNTER — Telehealth: Payer: Self-pay | Admitting: Internal Medicine

## 2019-08-16 NOTE — Telephone Encounter (Signed)
Scheduled per los. Called and left msg. Mailed printout  °

## 2019-08-17 ENCOUNTER — Other Ambulatory Visit: Payer: Self-pay | Admitting: Radiation Therapy

## 2019-08-18 ENCOUNTER — Telehealth: Payer: Self-pay | Admitting: Internal Medicine

## 2019-08-18 NOTE — Telephone Encounter (Signed)
R/s appt per 4/7 sch message - pt is aware

## 2019-08-19 ENCOUNTER — Other Ambulatory Visit: Payer: Self-pay | Admitting: Radiation Therapy

## 2019-09-01 ENCOUNTER — Telehealth: Payer: Self-pay | Admitting: *Deleted

## 2019-09-01 NOTE — Telephone Encounter (Signed)
Palliative Care services handled by [facility]  [phone]

## 2019-09-08 ENCOUNTER — Ambulatory Visit: Payer: Medicare PPO | Admitting: Internal Medicine

## 2019-09-10 ENCOUNTER — Ambulatory Visit
Admission: RE | Admit: 2019-09-10 | Discharge: 2019-09-10 | Disposition: A | Discharge status: Home / Self Care | Payer: Medicare PPO | Source: Ambulatory Visit | Attending: Internal Medicine | Admitting: Internal Medicine

## 2019-09-10 ENCOUNTER — Other Ambulatory Visit: Payer: Self-pay

## 2019-09-10 DIAGNOSIS — C711 Malignant neoplasm of frontal lobe: Secondary | ICD-10-CM

## 2019-09-10 IMAGING — MR MR HEAD WO/W CM
4 series · 42 of 48 positions shown · IV contrast (14ml multihance)
Comparison: Multiple outside studies, most recently [DATE]

CLINICAL DATA: Glioblastoma, surgery [DATE] and chemoradiation

EXAM:
MRI HEAD WITHOUT AND WITH CONTRAST
TECHNIQUE: Multiplanar, multiecho pulse sequences of the brain and surrounding
structures were obtained without and with intravenous contrast.
CONTRAST:  14mL MULTIHANCE GADOBENATE DIMEGLUMINE 529 MG/ML IV SOLN

[Series 5: T2 post-contrast · coronal · 4.5mm · 0.36mm/px · 7 of 35 slices shown]
[im 1/35]
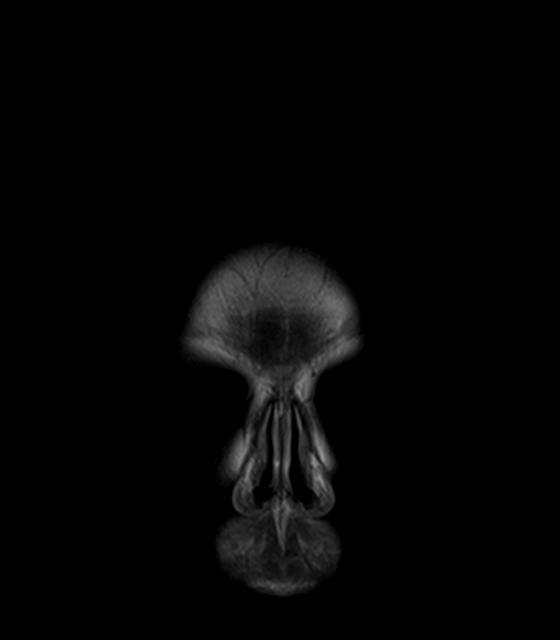
[im 6/35]
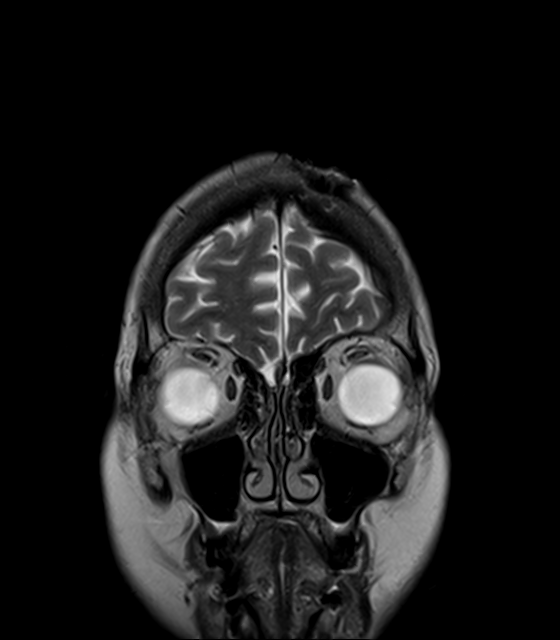
[im 12/35]
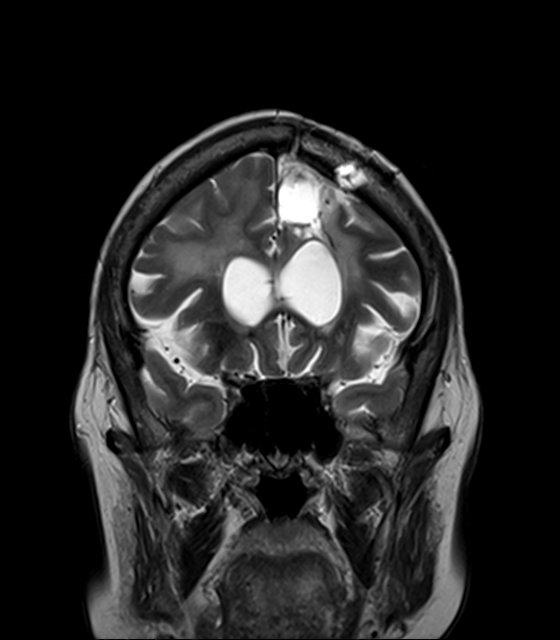
[im 18/35]
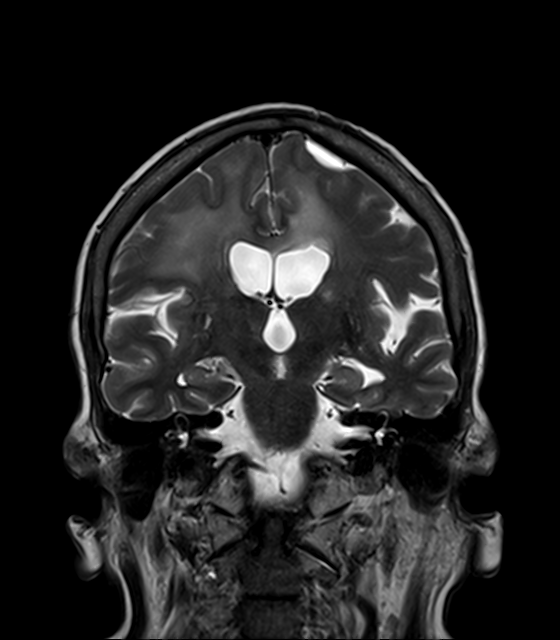
[im 23/35]
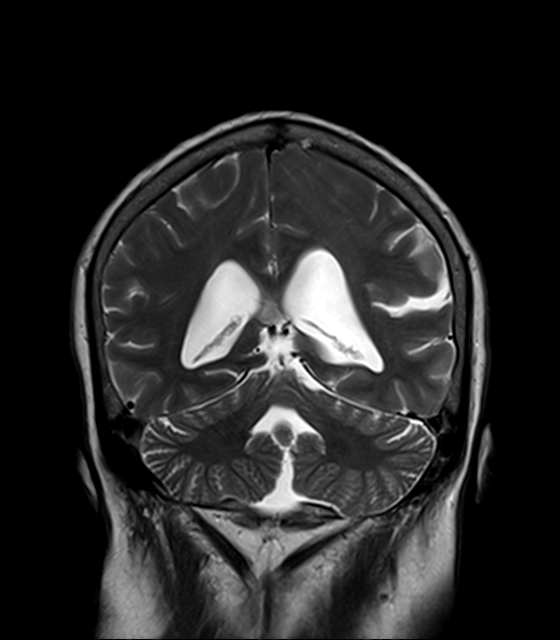
[im 29/35]
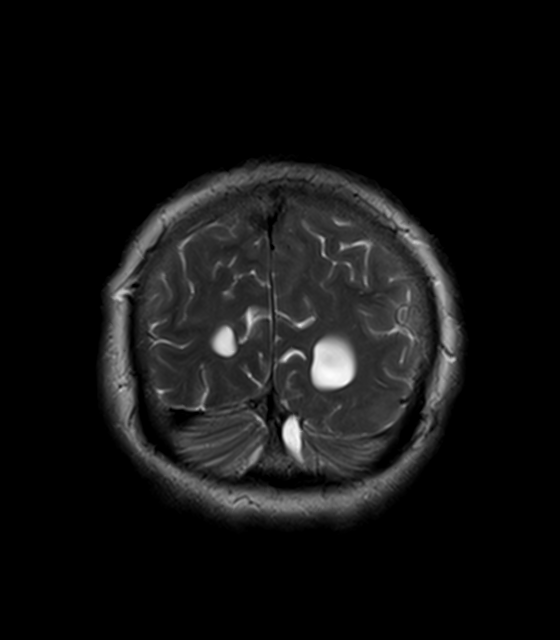
[im 35/35]
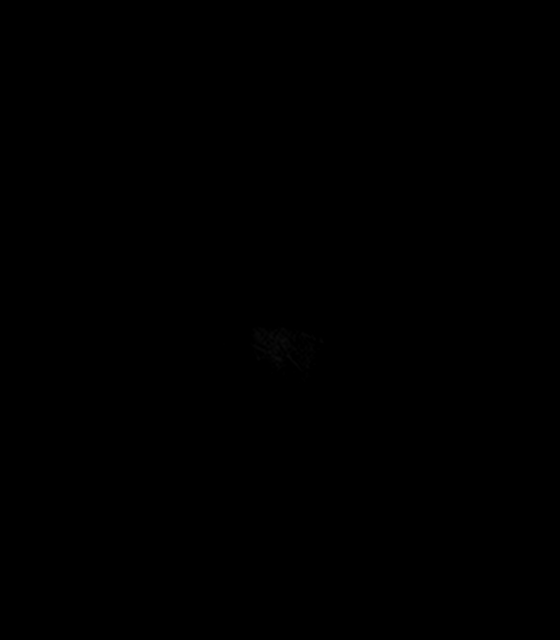

[Series 6: T1 · axial · 1.0mm · 0.94mm/px · z∈[-71,+82]mm · 23 of 160 slices shown]
[im 1/160]
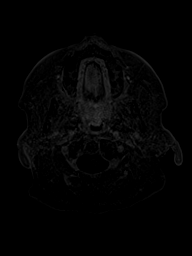
[im 6/160]
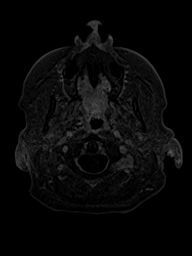
[im 12/160]
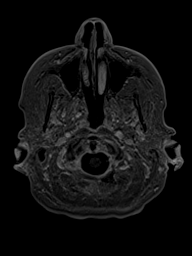
[im 18/160]
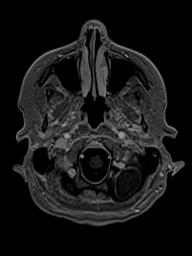
[im 23/160]
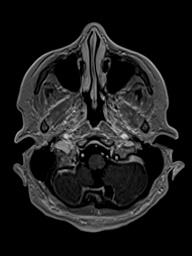
[im 29/160]
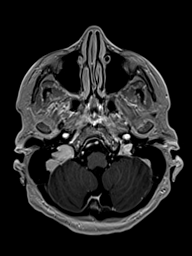
[im 35/160]
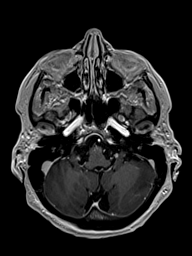
[im 40/160]
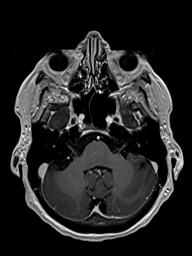
[im 46/160]
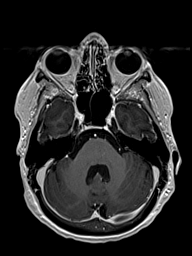
[im 52/160]
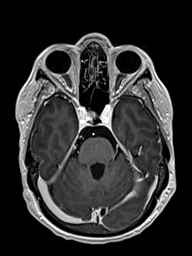
[im 57/160]
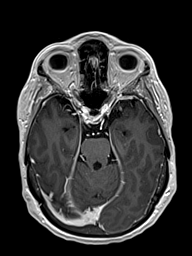
[im 63/160]
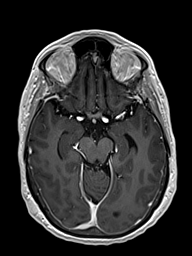
[im 69/160]
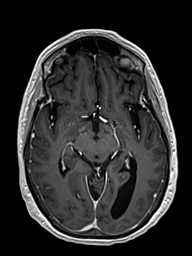
[im 74/160]
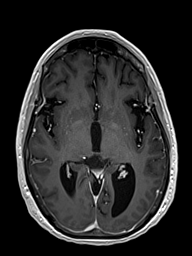
[im 80/160]
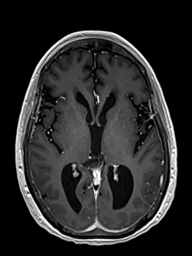
[im 86/160]
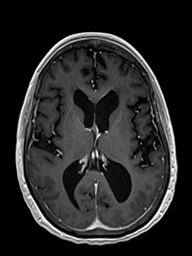
[im 91/160]
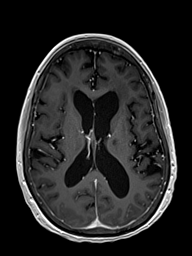
[im 97/160]
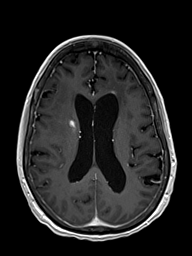
[im 103/160]
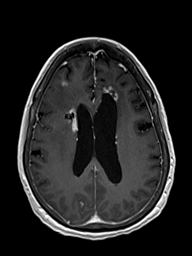
[im 108/160]
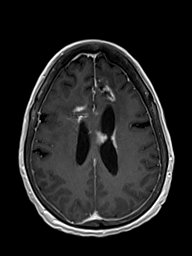
[im 131/160]
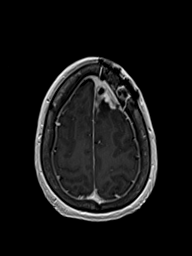
[im 137/160]
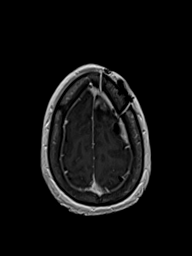
[im 154/160]
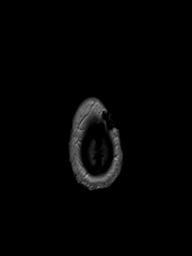

[Series 7: T1 post-contrast · coronal · 4.5mm · 0.72mm/px · 6 of 35 slices shown (1 of 2)]
[im 1/35]
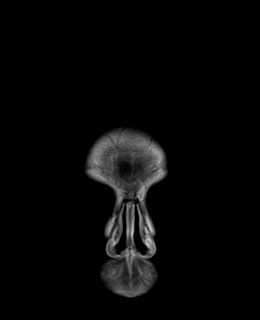
[im 7/35]
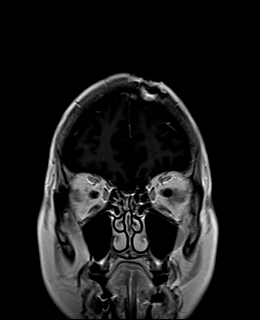
[im 14/35]
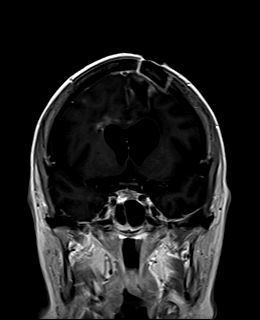
[im 21/35]
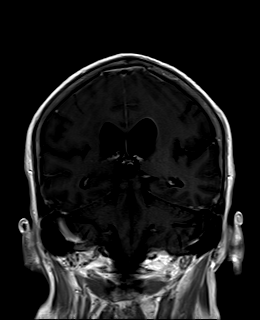
[im 28/35]
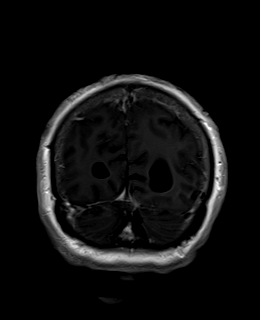
[im 35/35]
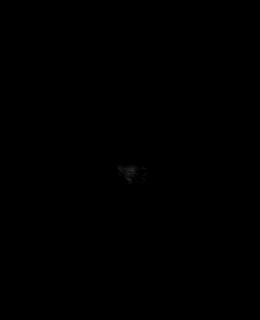

[Series 8: T1 post-contrast · sagittal · 4.0mm · 0.75mm/px · 6 of 31 slices shown (2 of 2)]
[im 1/31]
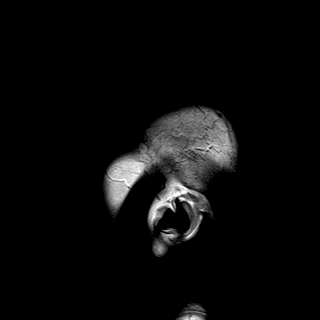
[im 7/31]
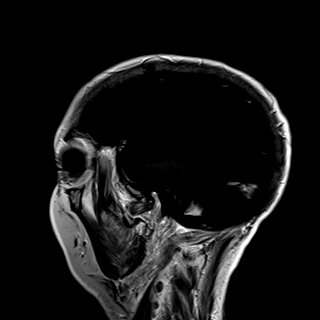
[im 13/31]
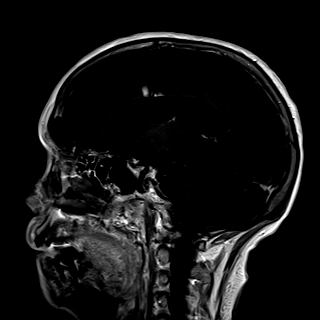
[im 19/31]
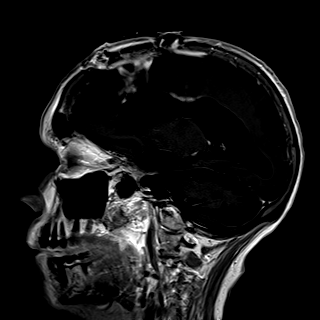
[im 25/31]
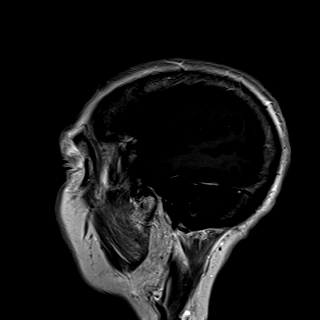
[im 31/31]
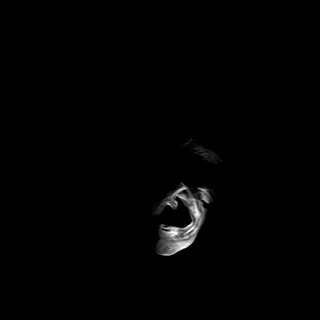

[42 of 48 positions shown; findings below may reference images not displayed]

FINDINGS: Brain: There are postoperative changes of left frontal craniotomy
with underlying resection cavity extending to the left lateral
ventricle margin. The cavity has contracted but there is
new/increased enhancement at the margins including areas of
nodularity, for example along the lateral margin as seen on series
6, image 120. Contralateral right frontal cystic/hemorrhagic lesion
has contracted with increased adjacent nodular enhancement (for
example series 6, images 102 and 110).

There is new abnormal enhancement along the superior margin of the
body of the left lateral ventricle (series 7, image 18). Additional
new ill-defined subcentimeter foci of enhancement in the more
peripheral right frontal lobe (series 6, images 103, 115, and 124).

Suboptimal comparison of abnormal T2 FLAIR hyperintensity in the
bifrontal lobes spanning the corpus callosum due to differences in
technique. Extent is similar with presumed decreased edema resulting
[REDACTED]reased effacement of the anterior lateral ventricles with
resolution of midline shift.

There is no acute infarction. Chronic blood products are present at
the operative site.

Vascular: Major vessel flow voids at the skull base are preserved.

Skull and upper cervical spine: Normal marrow signal is preserved.

Sinuses/Orbits: Paranasal sinuses are aerated. Orbits are
unremarkable.

Other: Sella is unremarkable.  Mastoid air cells are clear.
IMPRESSION: Increased and new enhancement as detailed above suspicious for
progression of disease probably with some component of treatment
change. Decreased edema with resolution of midline shift. Extent of
abnormal T2 FLAIR hyperintensity is similar.

## 2019-09-10 MED ORDER — GADOBENATE DIMEGLUMINE 529 MG/ML IV SOLN
14.0000 mL | Freq: Once | INTRAVENOUS | Status: AC | PRN
  Administered 2019-09-10: 14 mL via INTRAVENOUS

## 2019-09-12 ENCOUNTER — Inpatient Hospital Stay: Payer: Medicare PPO | Attending: Internal Medicine

## 2019-09-12 DIAGNOSIS — Z923 Personal history of irradiation: Secondary | ICD-10-CM | POA: Insufficient documentation

## 2019-09-12 DIAGNOSIS — C711 Malignant neoplasm of frontal lobe: Secondary | ICD-10-CM | POA: Insufficient documentation

## 2019-09-12 DIAGNOSIS — Z79899 Other long term (current) drug therapy: Secondary | ICD-10-CM | POA: Insufficient documentation

## 2019-09-13 NOTE — Progress Notes (Signed)
  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  The patient then received a 14 Gy boost to yield a final dose of 60 Gy.  Narrative: The patient tolerated radiation treatment relatively well.     Plan: The patient has completed radiation treatment. The patient will return to radiation oncology clinic for routine followup in one month. I advised the patient to call or return sooner if they have any questions or concerns related to their recovery or treatment. ________________________________  Jodelle Gross, M.D., Ph.D.

## 2019-09-15 ENCOUNTER — Other Ambulatory Visit: Payer: Self-pay

## 2019-09-15 ENCOUNTER — Inpatient Hospital Stay: Payer: Medicare PPO | Admitting: Internal Medicine

## 2019-09-15 VITALS — BP 151/78 | HR 78 | Temp 98.3°F | Resp 18 | Ht 61.0 in | Wt 146.6 lb

## 2019-09-15 DIAGNOSIS — Z79899 Other long term (current) drug therapy: Secondary | ICD-10-CM | POA: Diagnosis not present

## 2019-09-15 DIAGNOSIS — C711 Malignant neoplasm of frontal lobe: Secondary | ICD-10-CM | POA: Diagnosis not present

## 2019-09-15 DIAGNOSIS — Z923 Personal history of irradiation: Secondary | ICD-10-CM | POA: Diagnosis not present

## 2019-09-15 MED ORDER — DEXAMETHASONE 2 MG PO TABS: 2.0000 mg | ORAL_TABLET | Freq: Every day | ORAL | 1 refills | Status: DC

## 2019-09-15 MED ORDER — TEMOZOLOMIDE 100 MG PO CAPS: 100.0000 mg | ORAL_CAPSULE | Freq: Every day | ORAL | 0 refills | Status: AC

## 2019-09-15 MED ORDER — TEMOZOLOMIDE 140 MG PO CAPS: 140.0000 mg | ORAL_CAPSULE | Freq: Every day | ORAL | 0 refills | Status: AC

## 2019-09-15 NOTE — Progress Notes (Signed)
[facility] at [facility] [place], [place] [address] [phone]   Interval Evaluation  Date of Service: 09/15/19 Patient Name: Dorothy Mason Patient MRN: [id number] Patient DOB: 10-02-1950 Provider: Ventura Sellers, MD  Identifying Statement:  Dorothy Mason is a 70 y.o. female with left frontal glioblastoma   Oncologic History: Oncology History  Glioblastoma multiforme of frontal lobe ([facility])  05/16/2019 Surgery   Craniot[place], [place], [place]   07/04/2019 - 08/12/2019 Radiation Therapy   IMRT with concurrent Temozolomide 57m/m2     Biomarkers:  MGMT Unknown.  IDH 1/2 Wild type.  EGFR Unknown  TERT Unknown   Interval History:  Dorothy LAHAIEpresents today for follow up, now having completed radiation and concurrent Temodar.  She continues to experience fatigue lingering from radiation.  Overall impulses to speak, move, eat, converse have diminished somewhat, though maintains functional independence. Con't Keppra to 2561mtwice per day, well tolerated.  No further steroids.  Denies seizures or headaches.  H+P (06/20/19) Patient initially presented to medical attention in late December with several weeks of progressive word finding difficuly, confusion, and gait impairment.  [place] which demonstrated enhancing frontal masses consistent with primary brain tumor.  [place].  This was followed by several weeks of inpatient rehabilitation, where she was discharged with independent gait and otherwise fully intact.  She does acknowledge some difficulty with communication, and "slower" pace of activities such as dressing in the morning.  She denies any seizures or headaches, and has fully weaned off dexamethasone.      Medications: Current Outpatient Medications on File Prior to Visit   Medication Sig Dispense Refill  . aspirin 325 MG tablet Take 325 mg by mouth daily.    . Cholecalciferol (VITAMIN D3) 2000 UNITS capsule Take 2,000 Units by mouth daily.      . Marland KitchenevETIRAcetam (KEPPRA) 250 MG tablet Take 1 tablet (250 mg total) by mouth 2 (two) times daily. 60 tablet 3  . LORazepam (ATIVAN) 0.5 MG tablet 1 tablet po 30 minutes prior to radiation or MRI 40 tablet 0  . losartan (COZAAR) 50 MG tablet     . ondansetron (ZOFRAN) 8 MG tablet Take 1 tablet (8 mg total) by mouth 2 (two) times daily as needed (nausea and vomiting). May take 30-60 minutes prior to Temodar administration if nausea/vomiting occurs. 30 tablet 1  . temozolomide (TEMODAR) 100 MG capsule Take 1 capsule (100 mg total) by mouth daily. May take on an empty stomach to decrease nausea & vomiting. 42 capsule 0  . temozolomide (TEMODAR) 20 MG capsule Take 1 capsule (20 mg total) by mouth daily. May take on an empty stomach to decrease nausea & vomiting. 42 capsule 0   No current facility-administered medications on file prior to visit.    Allergies:  Allergies  Allergen Reactions  . Amoxicillin    Past Medical History: No past medical history on file. Past Surgical History: none  Social History:  Social History   Socioeconomic History  . Marital status: Married    Spouse name: Not on file  . Number of children: Not on file  . Years of education: Not on file  . Highest education level: Not on file  Occupational History  . Not on file  Tobacco Use  . Smoking status: Unknown If Ever  Smoked  . Smokeless tobacco: Never Used  . Tobacco comment: no smoking   Substance and Sexual Activity  . Alcohol use: No  . Drug use: Not on file  . Sexual activity: Not on file  Other Topics Concern  . 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    Social Determinants of Health   Financial Resource Strain:   . Difficulty of Paying Living Expenses:   Food Insecurity:   .  Worried About Charity fundraiser in the Last Year:   . Arboriculturist in the Last Year:   Transportation Needs:   . Film/video editor (Medical):   Marland Kitchen Lack of Transportation (Non-Medical):   Physical Activity:   . Days of Exercise per Week:   . Minutes of Exercise per Session:   Stress:   . Feeling of Stress :   Social Connections:   . Frequency of Communication with Friends and Family:   . Frequency of Social Gatherings with Friends and Family:   . Attends Religious Services:   . Active Member of Clubs or Organizations:   . Attends Archivist Meetings:   Marland Kitchen Marital Status:   Intimate Partner Violence:   . Fear of Current or Ex-Partner:   . Emotionally Abused:   Marland Kitchen Physically Abused:   . Sexually Abused:    Family History:  Family History  Problem Relation Age of Onset  . Stroke Other        grandmother  . Heart attack Other        grandfather - 47 and CHF     Review of Systems: Constitutional: Doesn't report fevers, chills or abnormal weight loss Eyes: Doesn't report blurriness of vision Ears, nose, mouth, throat, and face: Doesn't report sore throat Respiratory: Doesn't report cough, dyspnea or wheezes Cardiovascular: Doesn't report palpitation, chest discomfort  Gastrointestinal:  Doesn't report nausea, constipation, diarrhea GU: Doesn't report incontinence Skin: Doesn't report skin rashes Neurological: Per HPI Musculoskeletal: Doesn't report joint pain Behavioral/Psych: Doesn't report anxiety  Physical Exam: Vitals:   09/15/19 1238  BP: (!) 151/78  Pulse: 78  Resp: 18  Temp: 98.3 F (36.8 C)  SpO2: 100%   KPS: 80. General: Alert, cooperative, pleasant, in no acute distress Head: Craniotomy scar EENT: No conjunctival injection or scleral icterus.  Lungs: Resp effort normal Cardiac: Regular rate Abdomen: Non-distended abdomen Skin: No rashes cyanosis or petechiae. Extremities: No clubbing or edema  Neurologic Exam: Mental Status: Awake,  alert, attentive to examiner. Oriented to self and environment. Language has some impairment with fluency and comprehension.  Age advanced psychomotor slowing.  Features of abulia Cranial Nerves: Visual acuity is grossly normal. Visual fields are full. Extra-ocular movements intact. No ptosis. Face is symmetric Motor: Tone and bulk are normal. Power is full in both arms and legs. Reflexes are symmetric, no pathologic reflexes present.  Sensory: Intact to light touch Gait: Normal, deferred tandem.   Labs: I have reviewed the data as listed    Component Value Date/Time   NA 145 08/12/2019 1052   K 3.8 08/12/2019 1052   CL 109 08/12/2019 1052   CO2 26 08/12/2019 1052   GLUCOSE 95 08/12/2019 1052   BUN 14 08/12/2019 1052   CREATININE 0.82 08/12/2019 1052   CALCIUM 9.5 08/12/2019 1052   PROT 6.6 08/12/2019 1052   ALBUMIN 3.9 08/12/2019 1052   AST 22 08/12/2019 1052   ALT 23 08/12/2019 1052  ALKPHOS 46 08/12/2019 1052   BILITOT 0.6 08/12/2019 1052   GFRNONAA >60 08/12/2019 1052   GFRAA >60 08/12/2019 1052   Lab Results  Component Value Date   WBC 5.9 08/12/2019   NEUTROABS 4.4 08/12/2019   HGB 12.7 08/12/2019   HCT 39.7 08/12/2019   MCV 92.8 08/12/2019   PLT 237 08/12/2019   Imaging:  [place] Clinician Interpretation: I have personally reviewed the CNS images as listed.  My interpretation, in the context of the patient's clinical presentation, is treatment effect vs true progression  MR BRAIN W WO CONTRAST  Result Date: 09/12/2019 CLINICAL DATA:  Glioblastoma, surgery 05/16/2019 and chemoradiation EXAM: MRI HEAD WITHOUT AND WITH CONTRAST TECHNIQUE: Multiplanar, multiecho pulse sequences of the brain and surrounding structures were obtained without and with intravenous contrast. CONTRAST:  25m MULTIHANCE GADOBENATE DIMEGLUMINE 529 MG/ML IV SOLN COMPARISON:  Multiple outside studies, most recently 05/17/2019 FINDINGS: Brain: There are postoperative changes of left frontal craniotomy  with underlying resection cavity extending to the left lateral ventricle margin. The cavity has contracted but there is new/increased enhancement at the margins including areas of nodularity, for example along the lateral margin as seen on series 6, image 120. Contralateral right frontal cystic/hemorrhagic lesion has contracted with increased adjacent nodular enhancement (for example series 6, images 102 and 110). There is new abnormal enhancement along the superior margin of the body of the left lateral ventricle (series 7, image 18). Additional new ill-defined subcentimeter foci of enhancement in the more peripheral right frontal lobe (series 6, images 103, 115, and 124). Suboptimal comparison of abnormal T2 FLAIR hyperintensity in the bifrontal lobes spanning the corpus callosum due to differences in technique. Extent is similar with presumed decreased edema resulting in decreased effacement of the anterior lateral ventricles with resolution of midline shift. There is no acute infarction. Chronic blood products are present at the operative site. Vascular: Major vessel flow voids at the skull base are preserved. Skull and upper cervical spine: Normal marrow signal is preserved. Sinuses/Orbits: Paranasal sinuses are aerated. Orbits are unremarkable. Other: Sella is unremarkable.  Mastoid air cells are clear. IMPRESSION: Increased and new enhancement as detailed above suspicious for progression of disease probably with some component of treatment change. Decreased edema with resolution of midline shift. Extent of abnormal T2 FLAIR hyperintensity is similar. Electronically Signed   By: PMacy MisM.D.   On: 09/12/2019 08:29    Pre-op MRI:   Post-op MRI:      Assessment/Plan [place]li[place]e [place] frontal [place] clinically [place] today, now having completed IMRT and concurrent Temodar.  MRI demonstrates changes within the radiation field c/w either pseudoprogression or  organic tumor progression.  We recommended initiating treatment with Temozolomide 150 mg/m2, on for five days and off for twenty three days in twenty eight day cycles. The patient will have a complete blood count performed on days 21 and 28 of each cycle, and a comprehensive metabolic panel performed on day 28 of each cycle. Labs may need to be performed more often. Zofran will prescribed for home use for nausea/vomiting.   Chemotherapy should be held for the following:  ANC less than 1,000  Platelets less than 100,000  LFT or creatinine greater than 2x ULN  If clinical concerns/contraindications develop  We also discussed utility of tumor treating fields in the adjuvant setting for glioblastoma.  She will consider Optune and give [place] call if she is interested in pursuing it further.  Recommended 7  day trial of dexamethasone 41m daily given inflammation and noted worsening of abulic features.  She should return to clinic in 4 weeks prior to cycle #2 with labs for evaluation.  May continue Keppra 254mBID.  All questions were answered. The patient knows to call the clinic with any problems, questions or concerns. No barriers to learning were detected.  The total time spent in the encounter was 40 minutes and more than 50% was on counseling and review of test results   ZaVentura SellersMD Medical Director of Neuro-Oncology [facility] [place]5/06/21 12:35 PM

## 2019-09-16 ENCOUNTER — Telehealth: Payer: Self-pay | Admitting: Internal Medicine

## 2019-09-16 MED FILL — TEMOZOLOMIDE 140 MG CAPS: 140 | 5 days supply | Qty: 5 | Fill #0

## 2019-09-16 MED FILL — TEMOZOLOMIDE 100 MG CAPS: 100 | 5 days supply | Qty: 5 | Fill #0

## 2019-09-16 NOTE — Telephone Encounter (Signed)
Scheduled appt per 5/6 los.  Spoke with pt and she is aware of her appt date and time.

## 2019-09-20 ENCOUNTER — Telehealth: Payer: Self-pay | Admitting: *Deleted

## 2019-09-20 NOTE — Telephone Encounter (Signed)
Per Dr. Mickeal Skinner patient should continue Decadron 2 mg until her next visit with him.

## 2019-09-20 NOTE — Telephone Encounter (Signed)
Patient called to report that she started the Decadron 2 mg and states that she and her husband both feel that it has been helpful and they've seen some improvement in her energy level and speed.  Routed to MD to see if she should continue Decadron 2 mg until her next visit .    Dr. Mickeal Skinner please advise.

## 2019-09-27 ENCOUNTER — Telehealth: Payer: Self-pay | Admitting: *Deleted

## 2019-09-27 NOTE — Telephone Encounter (Signed)
Completed Attending Physicians Statement form and returned to [employer] [employer] to [phone]

## 2019-09-28 ENCOUNTER — Telehealth: Payer: Self-pay | Admitting: Internal Medicine

## 2019-09-28 NOTE — Telephone Encounter (Signed)
Faxed medical records to [facility] fax: [phone] Release I.d: [id number]

## 2019-10-17 ENCOUNTER — Telehealth: Payer: Self-pay | Admitting: *Deleted

## 2019-10-17 ENCOUNTER — Inpatient Hospital Stay: Payer: Medicare PPO

## 2019-10-17 ENCOUNTER — Inpatient Hospital Stay: Payer: Medicare PPO | Attending: Internal Medicine | Admitting: Internal Medicine

## 2019-10-17 ENCOUNTER — Other Ambulatory Visit: Payer: Self-pay

## 2019-10-17 VITALS — BP 157/80 | HR 73 | Temp 97.3°F | Resp 18 | Ht 61.0 in | Wt 147.0 lb

## 2019-10-17 DIAGNOSIS — Z923 Personal history of irradiation: Secondary | ICD-10-CM | POA: Diagnosis not present

## 2019-10-17 DIAGNOSIS — C711 Malignant neoplasm of frontal lobe: Secondary | ICD-10-CM

## 2019-10-17 DIAGNOSIS — Z79899 Other long term (current) drug therapy: Secondary | ICD-10-CM | POA: Diagnosis not present

## 2019-10-17 LAB — CBC WITH DIFFERENTIAL (CANCER CENTER ONLY)
Abs Immature Granulocytes: 0.04 10*3/uL (ref 0.00–0.07)
Basophils Absolute: 0 10*3/uL (ref 0.0–0.1)
Basophils Relative: 0 %
Eosinophils Absolute: 0.1 10*3/uL (ref 0.0–0.5)
Eosinophils Relative: 1 %
HCT: 42.2 % (ref 36.0–46.0)
Hemoglobin: 13.9 g/dL (ref 12.0–15.0)
Immature Granulocytes: 1 %
Lymphocytes Relative: 11 %
Lymphs Abs: 0.8 10*3/uL (ref 0.7–4.0)
MCH: 29.4 pg (ref 26.0–34.0)
MCHC: 32.9 g/dL (ref 30.0–36.0)
MCV: 89.4 fL (ref 80.0–100.0)
Monocytes Absolute: 0.5 10*3/uL (ref 0.1–1.0)
Monocytes Relative: 8 %
Neutro Abs: 5.2 10*3/uL (ref 1.7–7.7)
Neutrophils Relative %: 79 %
Platelet Count: 144 10*3/uL — ABNORMAL LOW (ref 150–400)
RBC: 4.72 MIL/uL (ref 3.87–5.11)
RDW: 14.5 % (ref 11.5–15.5)
WBC Count: 6.7 10*3/uL (ref 4.0–10.5)
nRBC: 0 % (ref 0.0–0.2)

## 2019-10-17 LAB — CMP (CANCER CENTER ONLY)
ALT: 12 U/L (ref 0–44)
AST: 12 U/L — ABNORMAL LOW (ref 15–41)
Albumin: 3.6 g/dL (ref 3.5–5.0)
Alkaline Phosphatase: 39 U/L (ref 38–126)
Anion gap: 10 (ref 5–15)
BUN: 18 mg/dL (ref 8–23)
CO2: 27 mmol/L (ref 22–32)
Calcium: 9.5 mg/dL (ref 8.9–10.3)
Chloride: 106 mmol/L (ref 98–111)
Creatinine: 0.88 mg/dL (ref 0.44–1.00)
GFR, Est AFR Am: >60 mL/min (ref >60)
GFR, Estimated: >60 mL/min (ref >60)
Glucose, Bld: 89 mg/dL (ref 70–99)
Potassium: 3.6 mmol/L (ref 3.5–5.1)
Sodium: 143 mmol/L (ref 135–145)
Total Bilirubin: 0.4 mg/dL (ref 0.3–1.2)
Total Protein: 6.2 g/dL — ABNORMAL LOW (ref 6.5–8.1)

## 2019-10-17 MED ORDER — DEXAMETHASONE 2 MG PO TABS: 2.0000 mg | ORAL_TABLET | Freq: Every day | ORAL | 1 refills | Status: AC

## 2019-10-17 NOTE — Telephone Encounter (Signed)
Received request for Attending Physician Statement from [employer].  Form was completed and returned to [phone].

## 2019-10-17 NOTE — Progress Notes (Signed)
Dorothy [place] at [place], [place] [address] [phone]   Interval Evaluation  Date of Service: 10/17/19 Patient Name: Dorothy Mason Patient MRN: [id number] Patient DOB: [birth year] Provider: Ventura Sellers, MD  Identifying Statement:  Dorothy Mason is a 69 y.o. female with left frontal glioblastoma   Oncologic History: Oncology History  Glioblastoma multiforme of frontal lobe ([place])  05/16/2019 Surgery   Craniotomy, debulking [place]   07/04/2019 - 08/12/2019 Radiation Therapy   IMRT with concurrent Temozolomide 103m/m2     Biomarkers:  MGMT Unknown.  IDH 1/2 Wild type.  EGFR Unknown  TERT Unknown   Interval History:  Dorothy CARNEVALEpresents today for follow up, now having completed first cycle of adjuvant Temodar.  No issues tolerating the chemo.  She continues to experience fatigue and poor motivation, but this has been significantly improved after the 221mdaily decadron was started.  Denies seizures or headaches.  H+P (06/20/19) Patient initially presented to medical attention in late December with several weeks of progressive word finding difficuly, confusion, and gait impairment.  She obtained CNS imaging locally in [place] at request of her PCP which demonstrated enhancing frontal masses consistent with primary brain tumor.  She then [place] crani[place] and [place].  This was followed by several weeks of inpatient rehabilitation, where she was discharged with independent gait and otherwise fully intact.  She does acknowledge some difficulty with communication, and "slower" pace of activities such as dressing in the morning.  She denies any seizures or headaches, and has fully weaned off dexamethasone.      Medications: Current Outpatient Medications on File Prior to Visit  Medication Sig Dispense Refill  . aspirin 325 MG tablet Take 325 mg by mouth  daily.    . Cholecalciferol (VITAMIN D3) 2000 UNITS capsule Take 2,000 Units by mouth daily.      . Marland Kitchenexamethasone (DECADRON) 2 MG tablet Take 1 tablet (2 mg total) by mouth daily. 30 tablet 1  . levETIRAcetam (KEPPRA) 250 MG tablet Take 1 tablet (250 mg total) by mouth 2 (two) times daily. 60 tablet 3  . losartan (COZAAR) 50 MG tablet     . LORazepam (ATIVAN) 0.5 MG tablet 1 tablet po 30 minutes prior to radiation or MRI (Patient not taking: Reported on 09/15/2019) 40 tablet 0  . ondansetron (ZOFRAN) 8 MG tablet Take 1 tablet (8 mg total) by mouth 2 (two) times daily as needed (nausea and vomiting). May take 30-60 minutes prior to Temodar administration if nausea/vomiting occurs. (Patient not taking: Reported on 09/15/2019) 30 tablet 1  . temozolomide (TEMODAR) 100 MG capsule Take 1 capsule (100 mg total) by mouth daily. May take on an empty stomach to decrease nausea & vomiting. (Patient not taking: Reported on 09/15/2019) 42 capsule 0  . temozolomide (TEMODAR) 100 MG capsule Take 1 capsule (100 mg total) by mouth daily. May take on an empty stomach to decrease nausea & vomiting. (Patient not taking: Reported on 10/17/2019) 5 capsule 0  . temozolomide (TEMODAR) 140 MG capsule Take 1 capsule (140 mg total) by mouth daily. May take on an empty stomach to decrease nausea & vomiting. (Patient not taking: Reported on 10/17/2019) 5 capsule 0  . temozolomide (TEMODAR) 20 MG capsule Take 1 capsule (20 mg total) by mouth daily. May take on an empty stomach to decrease nausea & vomiting. (Patient not taking: Reported on 09/15/2019)  42 capsule 0   No current facility-administered medications on file prior to visit.    Allergies:  Allergies  Allergen Reactions  . Amoxicillin    Past Medical History: No past medical history on file. Past Surgical History: none  Social History:  Social History   Socioeconomic History  . Marital status: Married    Spouse name: Not on file  . Number of children: Not on file  .  Years of education: Not on file  . Highest education level: Not on file  Occupational History  . Not on file  Tobacco Use  . Smoking status: Unknown If Ever Smoked  . Smokeless tobacco: Never Used  . Tobacco comment: no smoking   Substance and Sexual Activity  . Alcohol use: No  . Drug use: Not on file  . Sexual activity: Not on file  Other Topics Concern  . 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    Social Determinants of Health   Financial Resource Strain:   . Difficulty of Paying Living Expenses:   Food Insecurity:   . Worried About Charity fundraiser in the Last Year:   . Arboriculturist in the Last Year:   Transportation Needs:   . Film/video editor (Medical):   Marland Kitchen Lack of Transportation (Non-Medical):   Physical Activity:   . Days of Exercise per Week:   . Minutes of Exercise per Session:   Stress:   . Feeling of Stress :   Social Connections:   . Frequency of Communication with Friends and Family:   . Frequency of Social Gatherings with Friends and Family:   . Attends Religious Services:   . Active Member of Clubs or Organizations:   . Attends Archivist Meetings:   Marland Kitchen Marital Status:   Intimate Partner Violence:   . Fear of Current or Ex-Partner:   . Emotionally Abused:   Marland Kitchen Physically Abused:   . Sexually Abused:    Family History:  Family History  Problem Relation Age of Onset  . Stroke Other        grandmother  . Heart attack Other        grandfather - 74 and CHF     Review of Systems: Constitutional: Doesn't report fevers, chills or abnormal weight loss Eyes: Doesn't report blurriness of vision Ears, nose, mouth, throat, and face: Doesn't report sore throat Respiratory: Doesn't report cough, dyspnea or wheezes Cardiovascular: Doesn't report palpitation, chest discomfort  Gastrointestinal:  Doesn't report nausea, constipation, diarrhea GU: Doesn't report incontinence Skin: Doesn't report  skin rashes Neurological: Per HPI Musculoskeletal: Doesn't report joint pain Behavioral/Psych: Doesn't report anxiety  Physical Exam: Vitals:   10/17/19 1234  BP: (!) 157/80  Pulse: 73  Resp: 18  Temp: (!) 97.3 F (36.3 C)  SpO2: 100%   KPS: 80. General: Alert, cooperative, pleasant, in no acute distress Head: Craniotomy scar EENT: No conjunctival injection or scleral icterus.  Lungs: Resp effort normal Cardiac: Regular rate Abdomen: Non-distended abdomen Skin: No rashes cyanosis or petechiae. Extremities: No clubbing or edema  Neurologic Exam: Mental Status: Awake, alert, attentive to examiner. Oriented to self and environment. Language has some impairment with fluency and comprehension.  Age advanced psychomotor slowing.  Features of abulia Cranial Nerves: Visual acuity is grossly normal. Visual fields are full. Extra-ocular movements intact. No ptosis. Face is symmetric Motor: Tone and bulk are normal. Power is full in both arms and  legs. Reflexes are symmetric, no pathologic reflexes present.  Sensory: Intact to light touch Gait: Normal, deferred tandem.   Labs: I have reviewed the data as listed    Component Value Date/Time   NA 143 10/17/2019 1202   K 3.6 10/17/2019 1202   CL 106 10/17/2019 1202   CO2 27 10/17/2019 1202   GLUCOSE 89 10/17/2019 1202   BUN 18 10/17/2019 1202   CREATININE 0.88 10/17/2019 1202   CALCIUM 9.5 10/17/2019 1202   PROT 6.2 (L) 10/17/2019 1202   ALBUMIN 3.6 10/17/2019 1202   AST 12 (L) 10/17/2019 1202   ALT 12 10/17/2019 1202   ALKPHOS 39 10/17/2019 1202   BILITOT 0.4 10/17/2019 1202   GFRNONAA >60 10/17/2019 1202   GFRAA >60 10/17/2019 1202   Lab Results  Component Value Date   WBC 6.7 10/17/2019   NEUTROABS 5.2 10/17/2019   HGB 13.9 10/17/2019   HCT 42.2 10/17/2019   MCV 89.4 10/17/2019   PLT 144 (L) 10/17/2019    Pre-op MRI:   Post-op MRI:     Assessment/Plan Glioblastoma multiforme of frontal lobe ([place])    Hanley Hays is clinically stable today, now having completed cycle #1 of 5-day Temodar.    We recommended continuing treatment with Temozolomide 150 mg/m2, on for five days and off for twenty three days in twenty eight day cycles. The patient will have a complete blood count performed on days 21 and 28 of each cycle, and a comprehensive metabolic panel performed on day 28 of each cycle. Labs may need to be performed more often. Zofran will prescribed for home use for nausea/vomiting.   Chemotherapy should be held for the following:  ANC less than 1,000  Platelets less than 100,000  LFT or creatinine greater than 2x ULN  If clinical concerns/contraindications develop  Recommended continuing decadron at 40m daily.  She should return to clinic in 4 weeks prior to cycle #3 with MRI brain for evaluation.  May continue Keppra 2529mBID.  All questions were answered. The patient knows to call the clinic with any problems, questions or concerns. No barriers to learning were detected.  The total time spent in the encounter was 30 minutes and more than 50% was on counseling and review of test results   ZaVentura SellersMD Medical Director of Neuro-Oncology [facility] [place] [place]6/07/21 12:38 PM

## 2019-10-17 NOTE — Progress Notes (Signed)
Per request from patients spouse, faxed copy of today's labs to Dr. Leighton Ruff at [place] [phone].

## 2019-10-18 ENCOUNTER — Telehealth: Payer: Self-pay | Admitting: Internal Medicine

## 2019-10-18 NOTE — Telephone Encounter (Signed)
Scheduled appt per 6/7 los.  Spoke with pt and she is aware of the appt date and time.

## 2019-10-21 ENCOUNTER — Other Ambulatory Visit: Payer: Self-pay | Admitting: Internal Medicine

## 2019-10-21 DIAGNOSIS — C711 Malignant neoplasm of frontal lobe: Secondary | ICD-10-CM

## 2019-10-21 NOTE — Telephone Encounter (Signed)
Patient requesting refill. 

## 2019-10-24 ENCOUNTER — Telehealth: Payer: Self-pay | Admitting: *Deleted

## 2019-10-24 NOTE — Telephone Encounter (Signed)
Patient called to report some side effects.  She had a redness over the abdomen area without rash or itching.  Lasted for a couple of days and has since resolved.  Also reported benig extremely lethargic during this chemo cycle.  Routed to MD to advise if should be concerned about redness to the abdomen.

## 2019-10-24 NOTE — Telephone Encounter (Signed)
Per Dr. Mickeal Skinner no need to address redness over abdomen that has since resolved.  Left message to advise patient to let us know if anything else occurs.

## 2019-11-08 ENCOUNTER — Other Ambulatory Visit: Payer: Self-pay | Admitting: Radiation Therapy

## 2019-11-17 ENCOUNTER — Other Ambulatory Visit: Payer: Self-pay

## 2019-11-17 ENCOUNTER — Inpatient Hospital Stay: Payer: Medicare PPO

## 2019-11-17 ENCOUNTER — Inpatient Hospital Stay: Payer: Medicare PPO | Attending: Internal Medicine | Admitting: Internal Medicine

## 2019-11-17 ENCOUNTER — Ambulatory Visit (HOSPITAL_COMMUNITY)
Admission: RE | Admit: 2019-11-17 | Discharge: 2019-11-17 | Disposition: A | Discharge status: Home / Self Care | Payer: Medicare PPO | Source: Ambulatory Visit | Attending: Internal Medicine | Admitting: Internal Medicine

## 2019-11-17 VITALS — BP 158/85 | HR 75 | Temp 97.3°F | Resp 17 | Ht 61.0 in | Wt 148.5 lb

## 2019-11-17 DIAGNOSIS — Z7982 Long term (current) use of aspirin: Secondary | ICD-10-CM | POA: Insufficient documentation

## 2019-11-17 DIAGNOSIS — C711 Malignant neoplasm of frontal lobe: Secondary | ICD-10-CM

## 2019-11-17 DIAGNOSIS — R569 Unspecified convulsions: Secondary | ICD-10-CM | POA: Diagnosis not present

## 2019-11-17 DIAGNOSIS — Z79899 Other long term (current) drug therapy: Secondary | ICD-10-CM | POA: Insufficient documentation

## 2019-11-17 DIAGNOSIS — Z923 Personal history of irradiation: Secondary | ICD-10-CM | POA: Diagnosis not present

## 2019-11-17 LAB — CBC WITH DIFFERENTIAL (CANCER CENTER ONLY)
Abs Immature Granulocytes: 0.09 10*3/uL — ABNORMAL HIGH (ref 0.00–0.07)
Basophils Absolute: 0 10*3/uL (ref 0.0–0.1)
Basophils Relative: 0 %
Eosinophils Absolute: 0 10*3/uL (ref 0.0–0.5)
Eosinophils Relative: 0 %
HCT: 40.1 % (ref 36.0–46.0)
Hemoglobin: 13.5 g/dL (ref 12.0–15.0)
Immature Granulocytes: 1 %
Lymphocytes Relative: 9 %
Lymphs Abs: 0.7 10*3/uL (ref 0.7–4.0)
MCH: 30.3 pg (ref 26.0–34.0)
MCHC: 33.7 g/dL (ref 30.0–36.0)
MCV: 89.9 fL (ref 80.0–100.0)
Monocytes Absolute: 0.3 10*3/uL (ref 0.1–1.0)
Monocytes Relative: 4 %
Neutro Abs: 7.3 10*3/uL (ref 1.7–7.7)
Neutrophils Relative %: 86 %
Platelet Count: 140 10*3/uL — ABNORMAL LOW (ref 150–400)
RBC: 4.46 MIL/uL (ref 3.87–5.11)
RDW: 14.6 % (ref 11.5–15.5)
WBC Count: 8.5 10*3/uL (ref 4.0–10.5)
nRBC: 0 % (ref 0.0–0.2)

## 2019-11-17 LAB — CMP (CANCER CENTER ONLY)
ALT: 17 U/L (ref 0–44)
AST: 12 U/L — ABNORMAL LOW (ref 15–41)
Albumin: 3.8 g/dL (ref 3.5–5.0)
Alkaline Phosphatase: 43 U/L (ref 38–126)
Anion gap: 11 (ref 5–15)
BUN: 22 mg/dL (ref 8–23)
CO2: 27 mmol/L (ref 22–32)
Calcium: 10 mg/dL (ref 8.9–10.3)
Chloride: 106 mmol/L (ref 98–111)
Creatinine: 0.82 mg/dL (ref 0.44–1.00)
GFR, Est AFR Am: >60 mL/min (ref >60)
GFR, Estimated: >60 mL/min (ref >60)
Glucose, Bld: 126 mg/dL — ABNORMAL HIGH (ref 70–99)
Potassium: 3.3 mmol/L — ABNORMAL LOW (ref 3.5–5.1)
Sodium: 144 mmol/L (ref 135–145)
Total Bilirubin: 0.4 mg/dL (ref 0.3–1.2)
Total Protein: 6.6 g/dL (ref 6.5–8.1)

## 2019-11-17 IMAGING — MR MR HEAD WO/W CM
14 of 15 series · 41 of 48 positions shown · IV contrast (gadavist)
Comparison: [DATE]

CLINICAL DATA: Low blastoma with surgery [DATE], follow-up.

EXAM:
MRI HEAD WITHOUT AND WITH CONTRAST
TECHNIQUE: Multiplanar, multiecho pulse sequences of the brain and surrounding
structures were obtained without and with intravenous contrast.
CONTRAST:  6mL GADAVIST GADOBUTROL IV

[Series 5: DWI · axial · 3.0mm · 1.36mm/px · z∈[-42,+98]mm · 5 of 96 slices shown (1 of 4)]
[im 1/96]
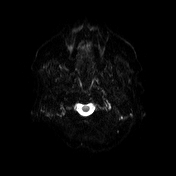
[im 24/96]
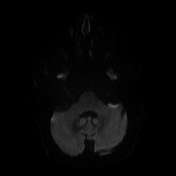
[im 48/96]
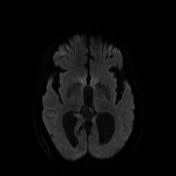
[im 72/96]
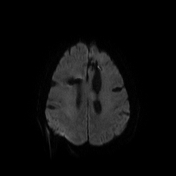
[im 96/96]
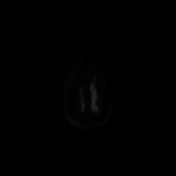

[Series 6: DWI · axial · 3.0mm · 1.36mm/px · z∈[-42,+98]mm · 2 of 48 slices shown (2 of 4)]
[im 1/48]
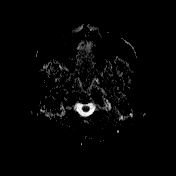
[im 48/48]
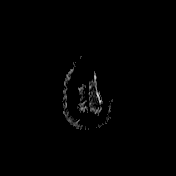

[Series 7: T1 · sagittal · 5.0mm · 0.75mm/px · 1 of 24 slices shown (1 of 2)]
[im 1/24]
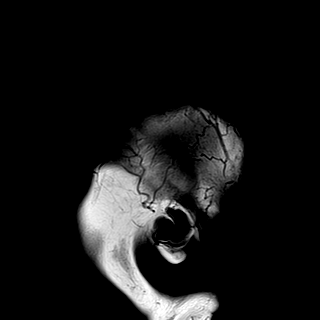

[Series 8: T2 · axial · 5.0mm · 0.62mm/px · 1 of 24 slices shown]
[im 1/24]
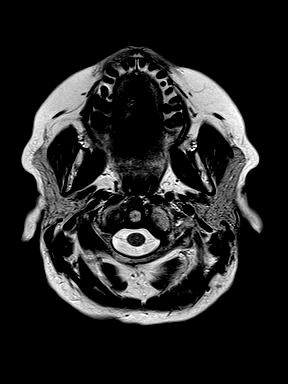

[Series 9: mip_images(sw) · axial · 24.0mm · 0.75mm/px · z∈[-39,+92]mm · 2 of 45 slices shown]
[im 1/45]
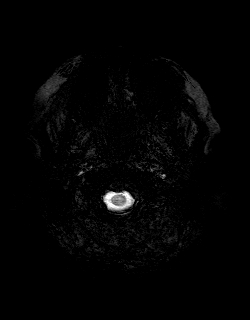
[im 45/45]
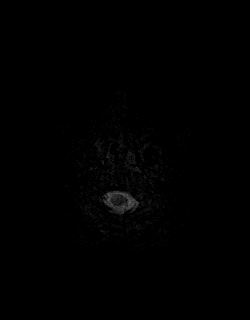

[Series 10: swi_images · axial · 3.0mm · 0.75mm/px · z∈[-49,+103]mm · 3 of 52 slices shown]
[im 1/52]
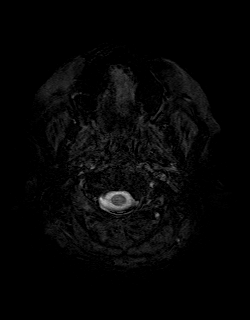
[im 26/52]
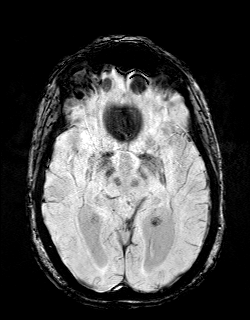
[im 52/52]
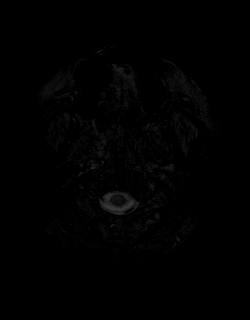

[Series 11: FLAIR · axial · 3.0mm · 0.75mm/px · z∈[-45,+107]mm · 3 of 52 slices shown]
[im 1/52]
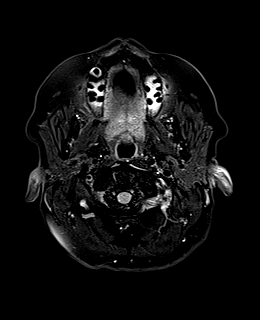
[im 26/52]
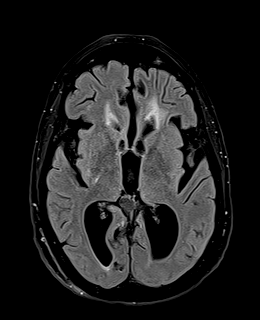
[im 52/52]
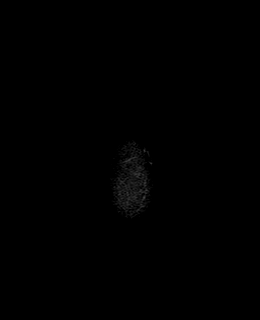

[Series 12: T1 · axial · 1.0mm · 0.94mm/px · z∈[-38,+104]mm · 7 of 144 slices shown (2 of 2)]
[im 1/144]
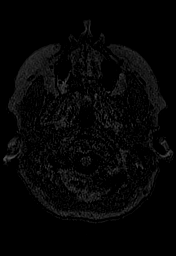
[im 24/144]
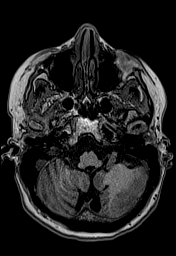
[im 48/144]
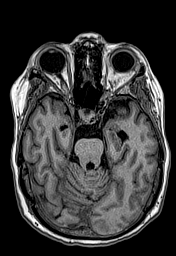
[im 72/144]
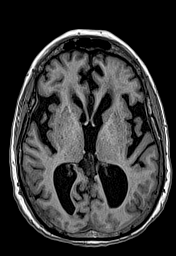
[im 96/144]
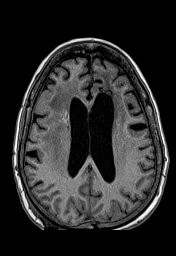
[im 120/144]
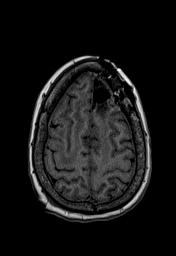
[im 144/144]
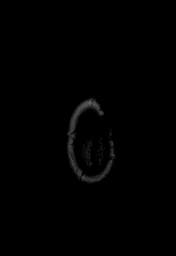

[Series 13: DWI · coronal · 5.0mm · 1.31mm/px · 3 of 64 slices shown (3 of 4)]
[im 1/64]
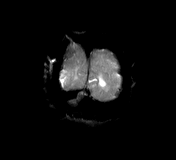
[im 32/64]
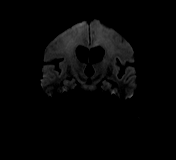
[im 64/64]
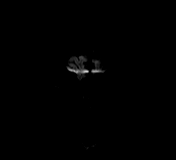

[Series 14: DWI · coronal · 5.0mm · 1.31mm/px · 2 of 32 slices shown (4 of 4)]
[im 1/32]
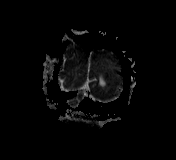
[im 32/32]
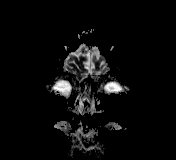

[Series 15: T2 post-contrast · coronal · 5.0mm · 0.57mm/px · 2 of 32 slices shown]
[im 1/32]
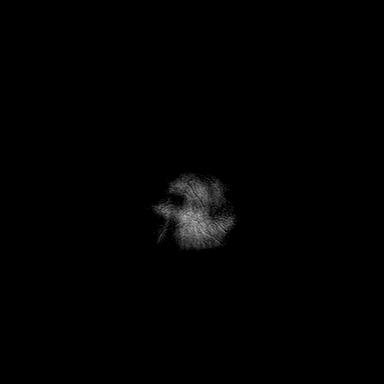
[im 32/32]
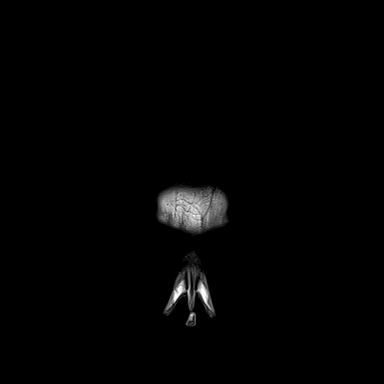

[Series 16: T1 post-contrast · axial · 1.0mm · 0.94mm/px · z∈[-38,+104]mm · 7 of 144 slices shown (1 of 3)]
[im 1/144]
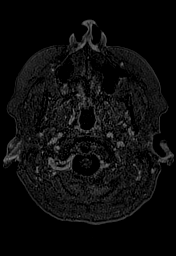
[im 24/144]
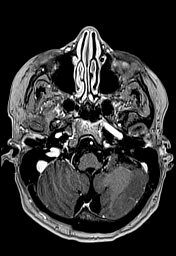
[im 48/144]
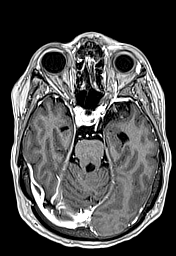
[im 72/144]
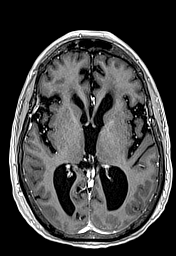
[im 96/144]
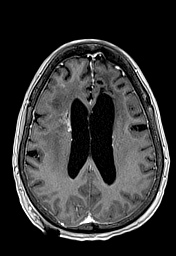
[im 120/144]
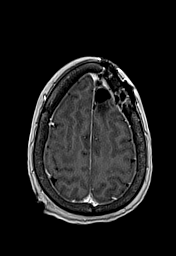
[im 144/144]
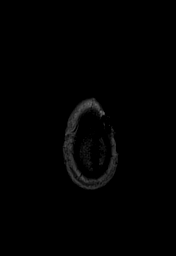

[Series 17: T1 post-contrast · coronal · 5.0mm · 0.43mm/px · 2 of 32 slices shown (2 of 3)]
[im 1/32]
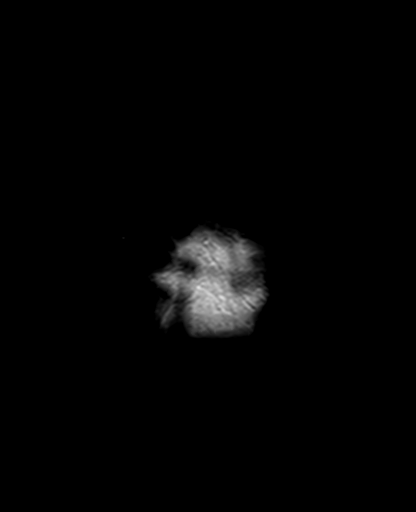
[im 32/32]
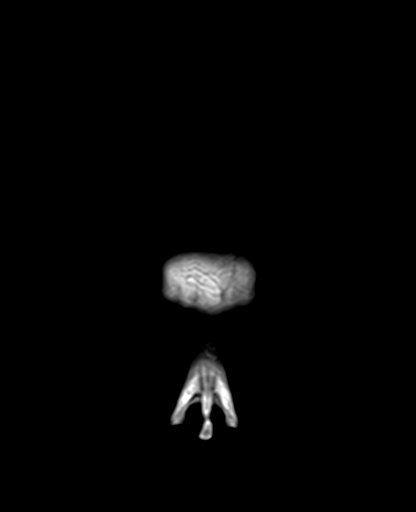

[Series 18: T1 post-contrast · sagittal · 5.0mm · 0.75mm/px · 1 of 24 slices shown (3 of 3)]
[im 1/24]
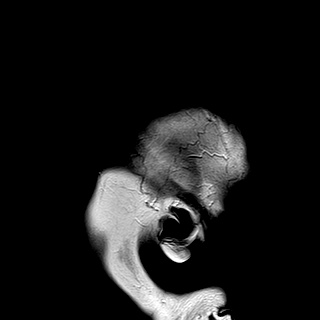

[41 of 48 positions shown; findings below may reference images not displayed]

FINDINGS: Brain: Glioblastoma with bifrontal T2 signal and patchy enhancement
which is both nodular and curvilinear about the lateral ventricles
and left frontal resection cavity. The areas of enhancement are less
intense and slightly less bulky with unchanged distribution. No
progressive T2 signal or mass effect. There is disproportionate
subarachnoid crowding at the vertex with ventriculomegaly,
nonprogressive. No complicating infarct, acute hemorrhage, or
midline shift. Non worrisome pineal region cyst with simple
architecture.

Vascular: Unremarkable flow voids and vascular enhancement

Skull and upper cervical spine: Unremarkable craniotomy site.

Sinuses/Orbits: Negative

Other: Recent fall with right-sided scalp laceration.
IMPRESSION: Glioblastoma without interval progression when compared to
[DATE].

## 2019-11-17 MED ORDER — GADOBUTROL 1 MMOL/ML IV SOLN: 6.0000 mL | Freq: Once | INTRAVENOUS | Status: DC | PRN

## 2019-11-17 MED ORDER — LEVETIRACETAM 250 MG PO TABS: 250.0000 mg | ORAL_TABLET | Freq: Two times a day (BID) | ORAL | 0 refills | Status: DC

## 2019-11-17 MED ORDER — GADOBUTROL 1 MMOL/ML IV SOLN
6.0000 mL | Freq: Once | INTRAVENOUS | Status: AC | PRN
  Administered 2019-11-17: 6 mL via INTRAVENOUS

## 2019-11-17 MED ORDER — TEMOZOLOMIDE 100 MG PO CAPS: 100.0000 mg | ORAL_CAPSULE | Freq: Every day | ORAL | 0 refills | Status: AC

## 2019-11-17 MED ORDER — TEMOZOLOMIDE 140 MG PO CAPS: 140.0000 mg | ORAL_CAPSULE | Freq: Every day | ORAL | 0 refills | Status: AC

## 2019-11-17 NOTE — Progress Notes (Signed)
[facility] at [place], [place] [address] [phone]   Interval Evaluation  Date of Service: 11/17/19 Patient Name: Dorothy Mason Patient MRN: [id number] Patient DOB: [birth year] Provider: Ventura Sellers, MD  Identifying Statement:  Dorothy Mason is a 69 y.o. 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  IDH 1/2 Wild type.  EGFR Unknown  TERT Unknown   Interval History:  Dorothy PIECHOWSKIpresents today for follow up, now having completed cycle #2 of adjuvant Temodar.  This weekend [place]  It may have been associated with LOC but patient is not sure, she was at baseline when found by husband shortly therafter.  At ED Keppra was increased to 759mBID.  Otherwise not having new issues tolerating the chemo, just the same lethargy from first month.  Fatigue and motivation issues are stable from prior.  Continues on 73m43mID decadron.    H+P (06/20/19) Patient initially presented to medical attention in late December with several weeks of progressive word finding difficuly, confusion, and gait impairment.  [place] CNS [place] at [place] of her [place]CP which demonstrated enhancing frontal masses consistent with primary brain tumor.  She then [place].  This was followed by several weeks of inpatient rehabilitation, where she was discharged with independent gait and otherwise fully intact.  She does acknowledge some difficulty with communication, and "slower" pace of activities such as dressing in the morning.  She denies any seizures or  headaches, and has fully weaned off dexamethasone.      Medications: Current Outpatient Medications on File Prior to Visit  Medication Sig Dispense Refill  . aspirin 325 MG tablet Take 325 mg by mouth daily.    . Cholecalciferol (VITAMIN D3) 2000 UNITS capsule Take 2,000 Units by mouth daily.      . dMarland Kitchenxamethasone (DECADRON) 2 MG tablet Take 1 tablet (2 mg total) by mouth daily. 30 tablet 1  . levETIRAcetam (KEPPRA) 250 MG tablet Take 1 tablet (250 mg total) by mouth 2 (two) times daily. 60 tablet 3  . LORazepam (ATIVAN) 0.5 MG tablet 1 tablet po 30 minutes prior to radiation or MRI (Patient not taking: Reported on 09/15/2019) 40 tablet 0  . losartan (COZAAR) 50 MG tablet     . ondansetron (ZOFRAN) 8 MG tablet Take 1 tablet (8 mg total) by mouth 2 (two) times daily as needed (nausea and vomiting). May take 30-60 minutes prior to Temodar administration if nausea/vomiting occurs. (Patient not taking: Reported on 09/15/2019) 30 tablet 1  . temozolomide (TEMODAR) 100 MG capsule Take 1 capsule (100 mg total) by mouth daily. May take on an empty stomach to decrease nausea & vomiting. (Patient not taking: Reported on 09/15/2019) 42 capsule 0  . temozolomide (TEMODAR) 100 MG capsule Take 1 capsule (100 mg total) by mouth daily. May take on an empty stomach to decrease nausea & vomiting. (Patient not taking: Reported on 10/17/2019) 5 capsule 0  . temozolomide (TEMODAR) 140 MG capsule Take 1 capsule (140 mg total) by mouth daily. May take on an empty stomach to decrease  nausea & vomiting. (Patient not taking: Reported on 10/17/2019) 5 capsule 0  . temozolomide (TEMODAR) 20 MG capsule Take 1 capsule (20 mg total) by mouth daily. May take on an empty stomach to decrease nausea & vomiting. (Patient not taking: Reported on 09/15/2019) 42 capsule 0   No current facility-administered medications on file prior to visit.    Allergies:  Allergies  Allergen Reactions  . Amoxicillin    Past Medical History: No past medical  history on file. Past Surgical History: none  Social History:  Social History   Socioeconomic History  . Marital status: Married    Spouse name: Not on file  . Number of children: Not on file  . Years of education: Not on file  . Highest education level: Not on file  Occupational History  . Not on file  Tobacco Use  . Smoking status: Unknown If Ever Smoked  . Smokeless tobacco: Never Used  . Tobacco comment: no smoking   Substance and Sexual Activity  . Alcohol use: No  . Drug use: Not on file  . Sexual activity: Not on file  Other Topics Concern  . 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    Social Determinants of Health   Financial Resource Strain:   . Difficulty of Paying Living Expenses:   Food Insecurity:   . Worried About Charity fundraiser in the Last Year:   . Arboriculturist in the Last Year:   Transportation Needs:   . Film/video editor (Medical):   Marland Kitchen Lack of Transportation (Non-Medical):   Physical Activity:   . Days of Exercise per Week:   . Minutes of Exercise per Session:   Stress:   . Feeling of Stress :   Social Connections:   . Frequency of Communication with Friends and Family:   . Frequency of Social Gatherings with Friends and Family:   . Attends Religious Services:   . Active Member of Clubs or Organizations:   . Attends Archivist Meetings:   Marland Kitchen Marital Status:   Intimate Partner Violence:   . Fear of Current or Ex-Partner:   . Emotionally Abused:   Marland Kitchen Physically Abused:   . Sexually Abused:    Family History:  Family History  Problem Relation Age of Onset  . Stroke Other        grandmother  . Heart attack Other        grandfather - 48 and CHF     Review of Systems: Constitutional: Doesn't report fevers, chills or abnormal weight loss Eyes: Doesn't report blurriness of vision Ears, nose, mouth, throat, and face: Doesn't report sore throat Respiratory: Doesn't report  cough, dyspnea or wheezes Cardiovascular: Doesn't report palpitation, chest discomfort  Gastrointestinal:  Doesn't report nausea, constipation, diarrhea GU: Doesn't report incontinence Skin: Doesn't report skin rashes Neurological: Per HPI Musculoskeletal: Doesn't report joint pain Behavioral/Psych: Doesn't report anxiety  Physical Exam: Vitals:   11/17/19 1238  BP: (!) 158/85  Pulse: 75  Resp: 17  Temp: (!) 97.3 F (36.3 C)  SpO2: 100%   KPS: 80. General: Alert, cooperative, pleasant, in no acute distress Head: Head laceration, staples EENT: No conjunctival injection or scleral icterus.  Lungs: Resp effort normal Cardiac: Regular rate Abdomen: Non-distended abdomen Skin: No rashes cyanosis or petechiae. Extremities: No clubbing or edema  Neurologic Exam: Mental Status: Awake, alert, attentive to examiner. Oriented to self and environment. Language has some  impairment with fluency and comprehension.  Age advanced psychomotor slowing.  Features of abulia Cranial Nerves: Visual acuity is grossly normal. Visual fields are full. Extra-ocular movements intact. No ptosis. Face is symmetric Motor: Tone and bulk are normal. Power is full in both arms and legs, with mild hip girdle weakness. Reflexes are symmetric, no pathologic reflexes present.  Sensory: Intact to light touch Gait: Normal, deferred tandem.   Labs: I have reviewed the data as listed    Component Value Date/Time   NA 143 10/17/2019 1202   K 3.6 10/17/2019 1202   CL 106 10/17/2019 1202   CO2 27 10/17/2019 1202   GLUCOSE 89 10/17/2019 1202   BUN 18 10/17/2019 1202   CREATININE 0.88 10/17/2019 1202   CALCIUM 9.5 10/17/2019 1202   PROT 6.2 (L) 10/17/2019 1202   ALBUMIN 3.6 10/17/2019 1202   AST 12 (L) 10/17/2019 1202   ALT 12 10/17/2019 1202   ALKPHOS 39 10/17/2019 1202   BILITOT 0.4 10/17/2019 1202   GFRNONAA >60 10/17/2019 1202   GFRAA >60 10/17/2019 1202   Lab Results  Component Value Date   WBC 8.5  11/17/2019   NEUTROABS 7.3 11/17/2019   HGB 13.5 11/17/2019   HCT 40.1 11/17/2019   MCV 89.9 11/17/2019   PLT 140 (L) 11/17/2019    Pre-op MRI:   Post-op MRI:   Imaging:  [facility] Clinician Interpretation: I have personally reviewed the CNS images as listed.  My interpretation, in the context of the patient's clinical presentation, is stable disease  No results found.  MRI brain from 11/17/19 pending official read.  My read is a preliminary impression.  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  No recurrence of seizures since increasing Keppra after event this weekend.   We recommended continuing treatment with cycle #3 Temozolomide, still at 150 mg/m2, on for five days and off for twenty three days in twenty eight day cycles. The patient will have a complete blood count performed on days 21 and 28 of each cycle, and a comprehensive metabolic panel performed on day 28 of each cycle. Labs may need to be performed more often. Zofran will prescribed for home use for nausea/vomiting.   She should start cycle #3 in one week to allow full healing of scalp laceration from fall.    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  All questions were answered. The patient knows to call the clinic with any problems, questions or concerns. No barriers to learning were detected.  The total  time spent in the encounter was 30 minutes and more than 50% was on counseling and review of test results  I have spent a total of 40 minutes of face-to-face and non-face-to-face time, excluding clinical staff time, preparing to see patient, ordering tests and/or medications, counseling the patient, and independently interpreting results and communicating results to the patient/family/caregiver.   ZacVentura SellersD Medical Director  of Neuro-Oncology [facility] at [place] 11/17/19 12:36 PM

## 2019-11-18 ENCOUNTER — Telehealth: Payer: Self-pay | Admitting: Internal Medicine

## 2019-11-18 NOTE — Telephone Encounter (Signed)
Scheduled per 7/8 los. Spoke with pt's husband and is aware of appt added.

## 2019-11-21 ENCOUNTER — Inpatient Hospital Stay: Payer: Medicare PPO

## 2019-11-22 MED FILL — TEMOZOLOMIDE 100 MG CAPS: 100 | 5 days supply | Qty: 5 | Fill #0

## 2019-11-22 MED FILL — TEMOZOLOMIDE 140 MG CAPS: 140 | 5 days supply | Qty: 5 | Fill #0

## 2019-11-24 ENCOUNTER — Telehealth: Payer: Self-pay | Admitting: Internal Medicine

## 2019-11-24 NOTE — Telephone Encounter (Signed)
Scheduled per 7/15 sch message. Spoke with pt's husband and is aware of appts.

## 2019-12-22 ENCOUNTER — Other Ambulatory Visit: Payer: Self-pay

## 2019-12-22 ENCOUNTER — Inpatient Hospital Stay: Payer: Medicare PPO

## 2019-12-22 ENCOUNTER — Inpatient Hospital Stay: Payer: Medicare PPO | Attending: Internal Medicine | Admitting: Internal Medicine

## 2019-12-22 VITALS — BP 124/80 | HR 74 | Temp 97.8°F | Resp 18 | Ht 61.0 in | Wt 147.2 lb

## 2019-12-22 DIAGNOSIS — C711 Malignant neoplasm of frontal lobe: Secondary | ICD-10-CM | POA: Diagnosis not present

## 2019-12-22 DIAGNOSIS — R569 Unspecified convulsions: Secondary | ICD-10-CM

## 2019-12-22 DIAGNOSIS — Z923 Personal history of irradiation: Secondary | ICD-10-CM | POA: Insufficient documentation

## 2019-12-22 LAB — CBC WITH DIFFERENTIAL (CANCER CENTER ONLY)
Abs Immature Granulocytes: 0.03 10*3/uL (ref 0.00–0.07)
Basophils Absolute: 0 10*3/uL (ref 0.0–0.1)
Basophils Relative: 1 %
Eosinophils Absolute: 0.1 10*3/uL (ref 0.0–0.5)
Eosinophils Relative: 1 %
HCT: 36.2 % (ref 36.0–46.0)
Hemoglobin: 12.1 g/dL (ref 12.0–15.0)
Immature Granulocytes: 1 %
Lymphocytes Relative: 14 %
Lymphs Abs: 0.8 10*3/uL (ref 0.7–4.0)
MCH: 31 pg (ref 26.0–34.0)
MCHC: 33.4 g/dL (ref 30.0–36.0)
MCV: 92.8 fL (ref 80.0–100.0)
Monocytes Absolute: 0.4 10*3/uL (ref 0.1–1.0)
Monocytes Relative: 7 %
Neutro Abs: 4.3 10*3/uL (ref 1.7–7.7)
Neutrophils Relative %: 76 %
Platelet Count: 178 10*3/uL (ref 150–400)
RBC: 3.9 MIL/uL (ref 3.87–5.11)
RDW: 14.9 % (ref 11.5–15.5)
WBC Count: 5.6 10*3/uL (ref 4.0–10.5)
nRBC: 0 % (ref 0.0–0.2)

## 2019-12-22 LAB — CMP (CANCER CENTER ONLY)
ALT: 12 U/L (ref 0–44)
AST: 12 U/L — ABNORMAL LOW (ref 15–41)
Albumin: 3.5 g/dL (ref 3.5–5.0)
Alkaline Phosphatase: 48 U/L (ref 38–126)
Anion gap: 7 (ref 5–15)
BUN: 22 mg/dL (ref 8–23)
CO2: 26 mmol/L (ref 22–32)
Calcium: 10 mg/dL (ref 8.9–10.3)
Chloride: 112 mmol/L — ABNORMAL HIGH (ref 98–111)
Creatinine: 0.81 mg/dL (ref 0.44–1.00)
GFR, Est AFR Am: >60 mL/min (ref >60)
GFR, Estimated: >60 mL/min (ref >60)
Glucose, Bld: 96 mg/dL (ref 70–99)
Potassium: 3.5 mmol/L (ref 3.5–5.1)
Sodium: 145 mmol/L (ref 135–145)
Total Bilirubin: 0.6 mg/dL (ref 0.3–1.2)
Total Protein: 6.3 g/dL — ABNORMAL LOW (ref 6.5–8.1)

## 2019-12-22 MED ORDER — TEMOZOLOMIDE 140 MG PO CAPS: 140.0000 mg | ORAL_CAPSULE | Freq: Every day | ORAL | 0 refills | Status: AC

## 2019-12-22 MED ORDER — TEMOZOLOMIDE 100 MG PO CAPS: 100.0000 mg | ORAL_CAPSULE | Freq: Every day | ORAL | 0 refills | Status: AC

## 2019-12-22 MED ORDER — METHYLPHENIDATE HCL 5 MG PO TABS
5.0000 mg | ORAL_TABLET | Freq: Every day | ORAL | 0 refills | Status: DC
Start: 2019-12-22 — End: 2020-01-26

## 2019-12-22 NOTE — Progress Notes (Signed)
[facility] [place], [place] [address] [phone]   Interval Evaluation  Date of Service: 12/22/19 Patient Name: Dorothy Mason Patient MRN: [id number] Patient DOB: [birth year] Provider: Ventura Sellers, MD  Identifying Statement:  CHANLER SCHREITER is a 69 y.o. 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  IDH 1/2 Wild type.  EGFR Unknown  TERT Unknown   Interval History:  Dorothy GIVHANpresents today for follow up, now having completed cycle #3 of adjuvant Temodar.  No further falls after last months hospitalization.  Continues to experience fatigue which has progressed gradually over time.  Currently sleeping up to 14 hours per day. Recently celebrated 69th birthday with family.  Recently completed decadron taper.   H+P (06/20/19) Patient initially presented to medical attention in late December with several weeks of progressive word finding difficuly, confusion, and gait impairment.  [place] at request of [place]CP which demonstrated enhancing frontal masses consistent with primary brain tumor.  She then [place].  This was followed by several weeks of inpatient rehabilitation, where she was discharged with independent gait and otherwise fully intact.  She does acknowledge some difficulty with communication, and "slower" pace of activities such as dressing in the morning.  She denies any seizures or headaches, and has fully weaned off dexamethasone.      Medications: Current Outpatient Medications on File Prior to Visit  Medication Sig Dispense Refill  .  aspirin 325 MG tablet Take 325 mg by mouth daily.    . Cholecalciferol (VITAMIN D3) 2000 UNITS capsule Take 2,000 Units by mouth daily.      .Marland Kitchendexamethasone (DECADRON) 2 MG tablet Take 1 tablet (2 mg total) by mouth daily. 30 tablet 1  . levETIRAcetam (KEPPRA) 250 MG tablet Take 1 tablet (250 mg total) by mouth 2 (two) times daily. 60 tablet 0  . LORazepam (ATIVAN) 0.5 MG tablet 1 tablet po 30 minutes prior to radiation or MRI (Patient not taking: Reported on 09/15/2019) 40 tablet 0  . losartan (COZAAR) 50 MG tablet     . ondansetron (ZOFRAN) 8 MG tablet Take 1 tablet (8 mg total) by mouth 2 (two) times daily as needed (nausea and vomiting). May take 30-60 minutes prior to Temodar administration if nausea/vomiting occurs. (Patient not taking: Reported on 09/15/2019) 30 tablet 1  . temozolomide (TEMODAR) 100 MG capsule Take 1 capsule (100 mg total) by mouth daily. May take on an empty stomach to decrease nausea & vomiting. (Patient not taking: Reported on 09/15/2019) 42 capsule 0  . temozolomide (TEMODAR) 100 MG capsule Take 1 capsule (100 mg total) by mouth daily. May take on an empty stomach to decrease nausea & vomiting. (Patient not taking: Reported on 10/17/2019) 5 capsule 0  . temozolomide (TEMODAR) 100 MG capsule Take 1 capsule (100 mg total) by mouth daily. May take on an empty stomach to decrease nausea & vomiting. 5 capsule 0  . temozolomide (TEMODAR) 140 MG capsule Take 1 capsule (140 mg total) by mouth daily. May take on an empty stomach to decrease nausea & vomiting. (Patient not taking: Reported on  10/17/2019) 5 capsule 0  . temozolomide (TEMODAR) 140 MG capsule Take 1 capsule (140 mg total) by mouth daily. May take on an empty stomach to decrease nausea & vomiting. 5 capsule 0  . temozolomide (TEMODAR) 20 MG capsule Take 1 capsule (20 mg total) by mouth daily. May take on an empty stomach to decrease nausea & vomiting. (Patient not taking: Reported on 09/15/2019) 42 capsule 0   No current  facility-administered medications on file prior to visit.    Allergies:  Allergies  Allergen Reactions  . Amoxicillin    Past Medical History: No past medical history on file. Past Surgical History: none  Social History:  Social History   Socioeconomic History  . Marital status: Married    Spouse name: Not on file  . Number of children: Not on file  . Years of education: Not on file  . Highest education level: Not on file  Occupational History  . Not on file  Tobacco Use  . Smoking status: Unknown If Ever Smoked  . Smokeless tobacco: Never Used  . Tobacco comment: no smoking   Substance and Sexual Activity  . Alcohol use: No  . Drug use: Not on file  . Sexual activity: Not on file  Other Topics Concern  . 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    Social Determinants of Health   Financial Resource Strain:   . Difficulty of Paying Living Expenses:   Food Insecurity:   . Worried About Charity fundraiser in the Last Year:   . Arboriculturist in the Last Year:   Transportation Needs:   . Film/video editor (Medical):   Marland Kitchen Lack of Transportation (Non-Medical):   Physical Activity:   . Days of Exercise per Week:   . Minutes of Exercise per Session:   Stress:   . Feeling of Stress :   Social Connections:   . Frequency of Communication with Friends and Family:   . Frequency of Social Gatherings with Friends and Family:   . Attends Religious Services:   . Active Member of Clubs or Organizations:   . Attends Archivist Meetings:   Marland Kitchen Marital Status:   Intimate Partner Violence:   . Fear of Current or Ex-Partner:   . Emotionally Abused:   Marland Kitchen Physically Abused:   . Sexually Abused:    Family History:  Family History  Problem Relation Age of Onset  . Stroke Other        grandmother  . Heart attack Other        grandfather - 7 and CHF     Review of Systems: Constitutional: Doesn't report fevers,  chills or abnormal weight loss Eyes: Doesn't report blurriness of vision Ears, nose, mouth, throat, and face: Doesn't report sore throat Respiratory: Doesn't report cough, dyspnea or wheezes Cardiovascular: Doesn't report palpitation, chest discomfort  Gastrointestinal:  Doesn't report nausea, constipation, diarrhea GU: Doesn't report incontinence Skin: Doesn't report skin rashes Neurological: Per HPI Musculoskeletal: Doesn't report joint pain Behavioral/Psych: Doesn't report anxiety  Physical Exam: Vitals:   12/22/19 1221  BP: 124/80  Pulse: 74  Resp: 18  Temp: 97.8 F (36.6 C)  SpO2: 99%   KPS: 80. General: Alert, cooperative, pleasant, in no acute distress Head: Head laceration, staples EENT: No conjunctival injection or scleral icterus.  Lungs: Resp effort normal Cardiac: Regular rate Abdomen: Non-distended abdomen Skin: No rashes cyanosis or petechiae. Extremities: No clubbing  or edema  Neurologic Exam: Mental Status: Awake, alert, attentive to examiner. Oriented to self and environment. Language has some impairment with fluency and comprehension.  Age advanced psychomotor slowing.  Features of abulia Cranial Nerves: Visual acuity is grossly normal. Visual fields are full. Extra-ocular movements intact. No ptosis. Face is symmetric Motor: Tone and bulk are normal. Power is full in both arms and legs, with mild hip girdle weakness. Reflexes are symmetric, no pathologic reflexes present.  Sensory: Intact to light touch Gait: Normal, deferred tandem.   Labs: I have reviewed the data as listed    Component Value Date/Time   NA 144 11/17/2019 1204   K 3.3 (L) 11/17/2019 1204   CL 106 11/17/2019 1204   CO2 27 11/17/2019 1204   GLUCOSE 126 (H) 11/17/2019 1204   BUN 22 11/17/2019 1204   CREATININE 0.82 11/17/2019 1204   CALCIUM 10.0 11/17/2019 1204   PROT 6.6 11/17/2019 1204   ALBUMIN 3.8 11/17/2019 1204   AST 12 (L) 11/17/2019 1204   ALT 17 11/17/2019 1204    ALKPHOS 43 11/17/2019 1204   BILITOT 0.4 11/17/2019 1204   GFRNONAA >60 11/17/2019 1204   GFRAA >60 11/17/2019 1204   Lab Results  Component Value Date   WBC 5.6 12/22/2019   NEUTROABS 4.3 12/22/2019   HGB 12.1 12/22/2019   HCT 36.2 12/22/2019   MCV 92.8 12/22/2019   PLT 178 12/22/2019    Pre-op MRI:   Post-op MRI:     Assessment/Plan 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  No further head trauma or seizure events.  We recommended continuing treatment with cycle #4 Temozolomide, still at 150 mg/m2, on for five days and off for twenty three days in twenty eight day cycles. The patient will have a complete blood count performed on days 21 and 28 of each cycle, and a comprehensive metabolic panel performed on day 28 of each cycle. Labs may need to be performed more often. Zofran will prescribed for home use for nausea/vomiting.   Chemotherapy should be held for the following:  ANC less than 1,000  Platelets less than 100,000  LFT or creatinine greater than 2x ULN  If clinical concerns/contraindications develop  Will con't Keppra 255m BID.  Will recommend trial of low dose Ritalin, 553mdaily, for ongoing issues with fatigue and increased sleep volume.  She should return to clinic in 4 weeks prior to cycle #5 with MRI brain for evaluation.  All questions were answered. The patient knows to call the clinic with any problems, questions or concerns. No barriers to learning were detected.  I have spent a total of 30 minutes of face-to-face and non-face-to-face time, excluding clinical staff time, preparing to see patient, ordering tests and/or medications, counseling the patient, and independently interpreting results and communicating results to the patient/family/caregiver.   ZaVentura SellersMD Medical Director of Neuro-Oncology [facility]8/12/21 12:31 PM

## 2019-12-23 ENCOUNTER — Telehealth: Payer: Self-pay | Admitting: Internal Medicine

## 2019-12-23 NOTE — Telephone Encounter (Signed)
Scheduled per 8/12 los. Unable to reach pt. Left voicemail with appt time and date.

## 2019-12-26 MED FILL — TEMOZOLOMIDE 100 MG CAPS: 100 | 5 days supply | Qty: 5 | Fill #0

## 2019-12-26 MED FILL — TEMOZOLOMIDE 140 MG CAPS: 140 | 5 days supply | Qty: 5 | Fill #0

## 2020-01-01 ENCOUNTER — Other Ambulatory Visit: Payer: Self-pay | Admitting: Internal Medicine

## 2020-01-03 ENCOUNTER — Other Ambulatory Visit: Payer: Self-pay | Admitting: *Deleted

## 2020-01-03 DIAGNOSIS — C711 Malignant neoplasm of frontal lobe: Secondary | ICD-10-CM

## 2020-01-03 MED ORDER — ONDANSETRON HCL 8 MG PO TABS: 8.0000 mg | ORAL_TABLET | Freq: Two times a day (BID) | ORAL | 1 refills | Status: AC | PRN

## 2020-01-19 ENCOUNTER — Other Ambulatory Visit: Payer: Self-pay | Admitting: Internal Medicine

## 2020-01-19 DIAGNOSIS — C711 Malignant neoplasm of frontal lobe: Secondary | ICD-10-CM

## 2020-01-26 ENCOUNTER — Inpatient Hospital Stay: Payer: Medicare PPO

## 2020-01-26 ENCOUNTER — Other Ambulatory Visit: Payer: Self-pay

## 2020-01-26 ENCOUNTER — Inpatient Hospital Stay: Payer: Medicare PPO | Attending: Internal Medicine | Admitting: Internal Medicine

## 2020-01-26 ENCOUNTER — Ambulatory Visit (HOSPITAL_COMMUNITY)
Admission: RE | Admit: 2020-01-26 | Discharge: 2020-01-26 | Disposition: A | Discharge status: Home / Self Care | Payer: Medicare PPO | Source: Ambulatory Visit | Attending: Internal Medicine | Admitting: Internal Medicine

## 2020-01-26 VITALS — BP 141/69 | HR 70 | Temp 97.8°F | Resp 20 | Ht 61.0 in | Wt 147.0 lb

## 2020-01-26 DIAGNOSIS — Z923 Personal history of irradiation: Secondary | ICD-10-CM | POA: Diagnosis not present

## 2020-01-26 DIAGNOSIS — C711 Malignant neoplasm of frontal lobe: Secondary | ICD-10-CM

## 2020-01-26 DIAGNOSIS — Z79899 Other long term (current) drug therapy: Secondary | ICD-10-CM | POA: Insufficient documentation

## 2020-01-26 DIAGNOSIS — R569 Unspecified convulsions: Secondary | ICD-10-CM | POA: Diagnosis not present

## 2020-01-26 LAB — CMP (CANCER CENTER ONLY)
ALT: 37 U/L (ref 0–44)
AST: 22 U/L (ref 15–41)
Albumin: 3.5 g/dL (ref 3.5–5.0)
Alkaline Phosphatase: 50 U/L (ref 38–126)
Anion gap: 8 (ref 5–15)
BUN: 18 mg/dL (ref 8–23)
CO2: 25 mmol/L (ref 22–32)
Calcium: 9.4 mg/dL (ref 8.9–10.3)
Chloride: 110 mmol/L (ref 98–111)
Creatinine: 0.71 mg/dL (ref 0.44–1.00)
GFR, Est AFR Am: >60 mL/min (ref >60)
GFR, Estimated: >60 mL/min (ref >60)
Glucose, Bld: 114 mg/dL — ABNORMAL HIGH (ref 70–99)
Potassium: 3.4 mmol/L — ABNORMAL LOW (ref 3.5–5.1)
Sodium: 143 mmol/L (ref 135–145)
Total Bilirubin: 0.5 mg/dL (ref 0.3–1.2)
Total Protein: 6.1 g/dL — ABNORMAL LOW (ref 6.5–8.1)

## 2020-01-26 LAB — CBC WITH DIFFERENTIAL (CANCER CENTER ONLY)
Abs Immature Granulocytes: 0.01 10*3/uL (ref 0.00–0.07)
Basophils Absolute: 0 10*3/uL (ref 0.0–0.1)
Basophils Relative: 0 %
Eosinophils Absolute: 0.1 10*3/uL (ref 0.0–0.5)
Eosinophils Relative: 2 %
HCT: 34.4 % — ABNORMAL LOW (ref 36.0–46.0)
Hemoglobin: 11.4 g/dL — ABNORMAL LOW (ref 12.0–15.0)
Immature Granulocytes: 0 %
Lymphocytes Relative: 15 %
Lymphs Abs: 0.7 10*3/uL (ref 0.7–4.0)
MCH: 31.1 pg (ref 26.0–34.0)
MCHC: 33.1 g/dL (ref 30.0–36.0)
MCV: 94 fL (ref 80.0–100.0)
Monocytes Absolute: 0.3 10*3/uL (ref 0.1–1.0)
Monocytes Relative: 7 %
Neutro Abs: 3.6 10*3/uL (ref 1.7–7.7)
Neutrophils Relative %: 76 %
Platelet Count: 170 10*3/uL (ref 150–400)
RBC: 3.66 MIL/uL — ABNORMAL LOW (ref 3.87–5.11)
RDW: 13 % (ref 11.5–15.5)
WBC Count: 4.8 10*3/uL (ref 4.0–10.5)
nRBC: 0 % (ref 0.0–0.2)

## 2020-01-26 IMAGING — MR MR HEAD WO/W CM
16 of 17 series · 41 of 48 positions shown · IV contrast (gadavist)
Comparison: MRI head [DATE], [DATE]

CLINICAL DATA: Glioblastoma.  Surgical resection [DATE].

EXAM:
MRI HEAD WITHOUT AND WITH CONTRAST
TECHNIQUE: Multiplanar, multiecho pulse sequences of the brain and surrounding
structures were obtained without and with intravenous contrast.
CONTRAST:  6mL GADAVIST GADOBUTROL 1 MMOL/ML IV SOLN

[Series 5: DWI · axial · 3.0mm · 1.36mm/px · z∈[-56,+95]mm · 6 of 104 slices shown (1 of 4)]
[im 1/104]
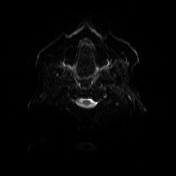
[im 21/104]
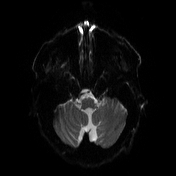
[im 42/104]
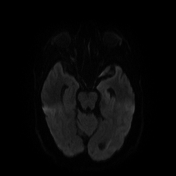
[im 62/104]
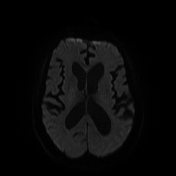
[im 83/104]
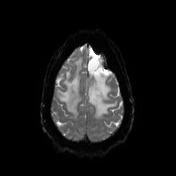
[im 104/104]
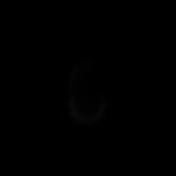

[Series 6: DWI · axial · 3.0mm · 1.36mm/px · z∈[-56,+95]mm · 2 of 52 slices shown (2 of 4)]
[im 1/52]
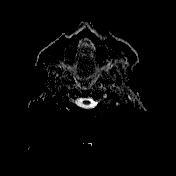
[im 52/52]
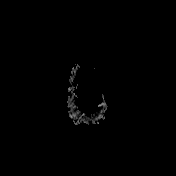

[Series 7: T1 · sagittal · 5.0mm · 0.75mm/px · 1 of 24 slices shown (1 of 4)]
[im 1/24]
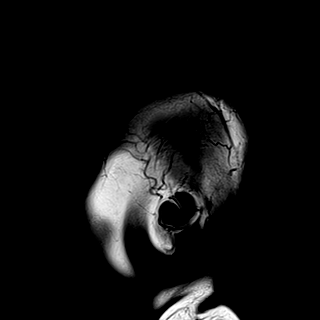

[Series 8: T2 · axial · 5.0mm · 0.62mm/px · 1 of 24 slices shown]
[im 1/24]
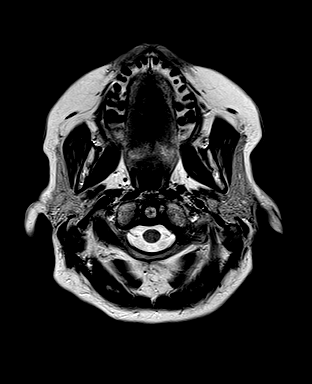

[Series 9: mip_images(sw) · axial · 24.0mm · 0.75mm/px · z∈[-50,+92]mm · 2 of 49 slices shown]
[im 1/49]
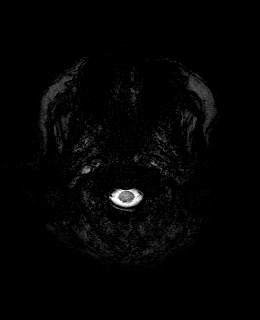
[im 49/49]
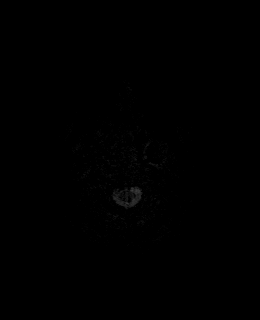

[Series 10: swi_images · axial · 3.0mm · 0.75mm/px · z∈[-60,+103]mm · 3 of 56 slices shown]
[im 1/56]
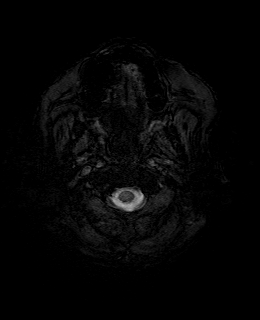
[im 28/56]
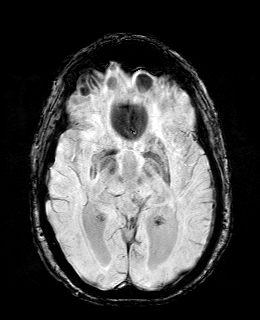
[im 56/56]
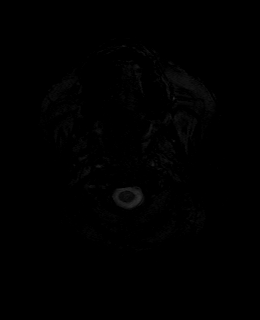

[Series 11: FLAIR · axial · 3.0mm · 0.75mm/px · z∈[-54,+97]mm · 2 of 52 slices shown]
[im 1/52]
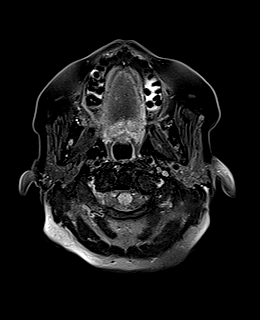
[im 52/52]
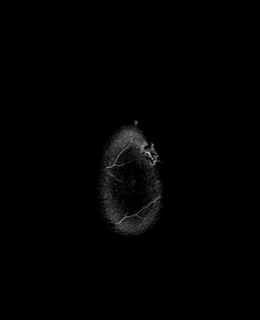

[Series 12: T1 · axial · 1.0mm · 0.94mm/px · z∈[-46,+95]mm · 7 of 144 slices shown (2 of 4)]
[im 1/144]
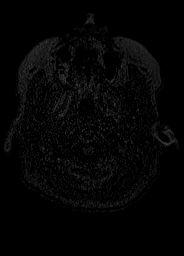
[im 24/144]
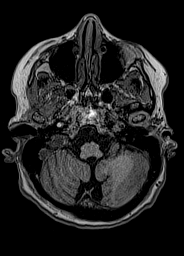
[im 48/144]
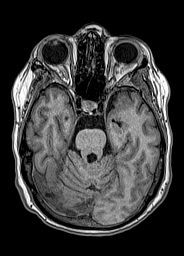
[im 72/144]
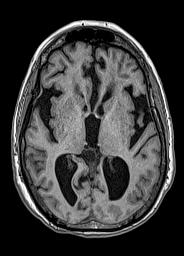
[im 96/144]
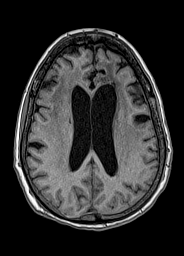
[im 120/144]
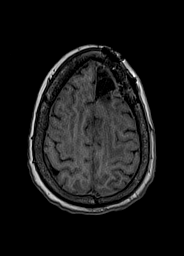
[im 144/144]
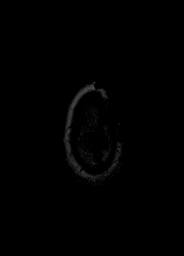

[Series 13: DWI · coronal · 5.0mm · 1.31mm/px · 3 of 64 slices shown (3 of 4)]
[im 1/64]
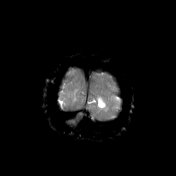
[im 32/64]
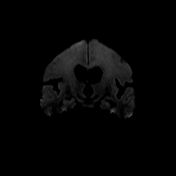
[im 64/64]
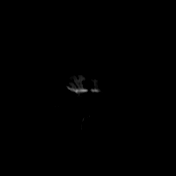

[Series 14: DWI · coronal · 5.0mm · 1.31mm/px · 2 of 32 slices shown (4 of 4)]
[im 1/32]
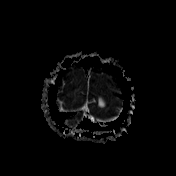
[im 32/32]
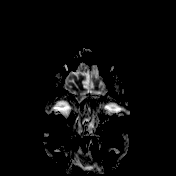

[Series 15: T2 post-contrast · coronal · 5.0mm · 0.57mm/px · 1 of 26 slices shown]
[im 1/26]
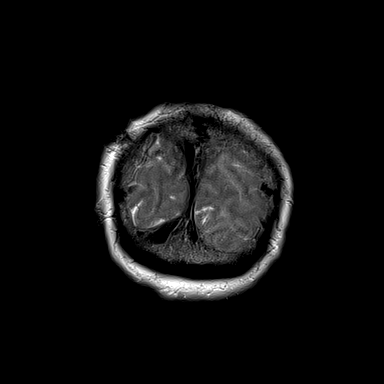

[Series 16: T1 post-contrast · axial · 1.0mm · 0.94mm/px · z∈[-46,+95]mm · 7 of 144 slices shown (1 of 3)]
[im 1/144]
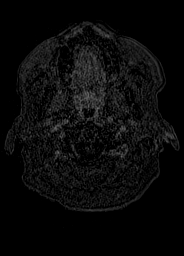
[im 24/144]
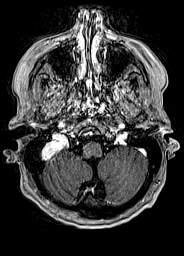
[im 48/144]
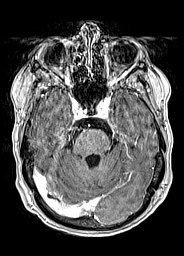
[im 72/144]
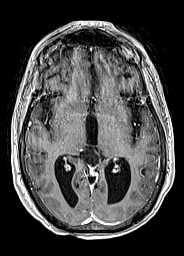
[im 96/144]
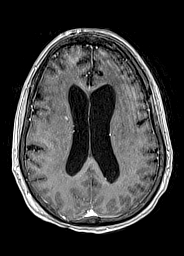
[im 120/144]
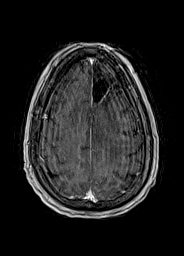
[im 144/144]
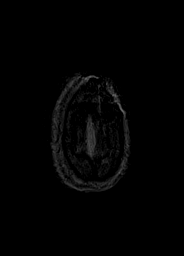

[Series 17: T1 · sagittal · 4.0mm · 0.94mm/px · 1 of 30 slices shown (3 of 4)]
[im 1/30]
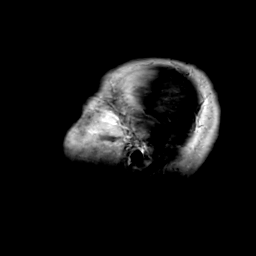

[Series 18: T1 · coronal · 4.0mm · 0.94mm/px · 1 of 30 slices shown (4 of 4)]
[im 1/30]
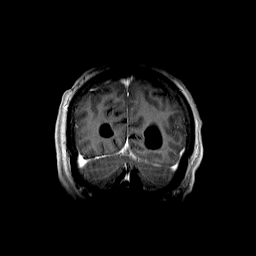

[Series 19: T1 post-contrast · coronal · 5.0mm · 0.43mm/px · 1 of 26 slices shown (2 of 3)]
[im 1/26]
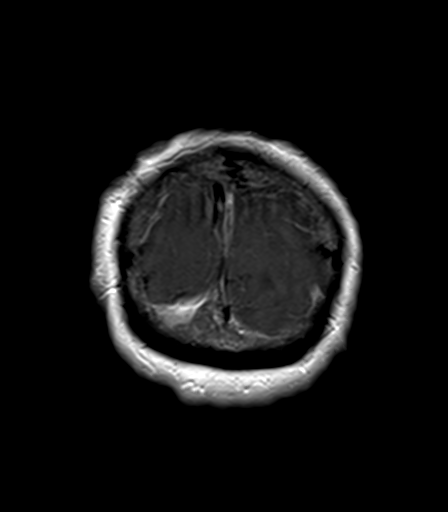

[Series 20: T1 post-contrast · sagittal · 5.0mm · 0.75mm/px · 1 of 24 slices shown (3 of 3)]
[im 1/24]
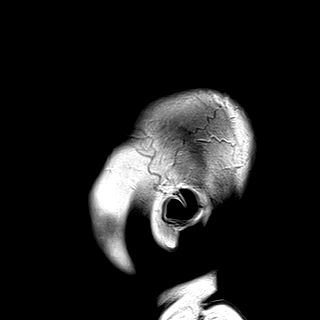

[41 of 48 positions shown; findings below may reference images not displayed]

FINDINGS: Brain: Today's study is motion degraded particularly on the
postcontrast imaging limiting anatomical detail.

Left frontal craniotomy for tumor resection. Surgical cavity in the
high left anterior frontal lobe is unchanged. Minimal enhancement in
the cavity wall is stable.

Irregular enhancement along the roof of the left lateral ventricle
involving the corpus callosum has progressed in the interval. This
is most likely tumor progression.

Patchy areas of enhancement in the right frontal lobe appear
improved in the interval. This is limited by motion. Chronic
hemorrhage in the right frontal lobe is stable with improvement in
surrounding enhancement.

Diffuse white matter hyperintensity on FLAIR bilaterally with mild
progression.

Generalized atrophy and ventricular enlargement is stable. Negative
for acute infarct.

Vascular: Negative for hyperdense vessel

Skull and upper cervical spine: Left frontal craniotomy.

Sinuses/Orbits: Paranasal sinuses clear.  Negative orbit

Other: None
IMPRESSION: Progression of enhancement in the left corpus callosum and adjacent
periventricular white matter most compatible with tumor progression.
This area now measures 15 x 27 mm.

Patchy areas of enhancement in the right frontal lobe appear
improved.

Diffuse white matter hyperintensity on FLAIR shows mild progression.

Today's study is degraded by motion particularly on the postcontrast
images.

## 2020-01-26 MED ORDER — LEVETIRACETAM 250 MG PO TABS: 250.0000 mg | ORAL_TABLET | Freq: Two times a day (BID) | ORAL | 2 refills | Status: AC

## 2020-01-26 MED ORDER — METHYLPHENIDATE HCL 10 MG PO TABS: 10.0000 mg | ORAL_TABLET | Freq: Two times a day (BID) | ORAL | 0 refills | Status: DC

## 2020-01-26 MED ORDER — GADOBUTROL 1 MMOL/ML IV SOLN
6.0000 mL | Freq: Once | INTRAVENOUS | Status: AC | PRN
  Administered 2020-01-26: 6 mL via INTRAVENOUS

## 2020-01-26 MED ORDER — TEMOZOLOMIDE 140 MG PO CAPS: 140.0000 mg | ORAL_CAPSULE | Freq: Every day | ORAL | 0 refills | Status: AC

## 2020-01-26 MED FILL — TEMOZOLOMIDE 140 MG CAPS: 140 | 28 days supply | Qty: 5 | Fill #0

## 2020-01-26 NOTE — Progress Notes (Signed)
[facility] [place], [place] [address] [phone]   Interval Evaluation  Date of Service: 01/26/20 Patient Name: Dorothy Mason Patient MRN: [id number] Patient DOB: 08-Jul-1950 Provider: Ventura Sellers, MD  Identifying Statement:  Dorothy Mason is a 69 y.o. 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  IDH 1/2 Wild type.  EGFR Unknown  TERT Unknown   Interval History:  Dorothy LIZpresents today for follow up, now having completed cycle #4 of adjuvant Temodar.  She and her husband do feel the Ritalin has helped her energy levels, but she is still frequently tired and slow with thoughts.  These symptoms are clearly worse during the week of chemotherapy. Mostly using walker for ambulation, few trips outside the home.  No recent seizures.  H+P (06/20/19) Patient initially presented to medical attention in late December with several weeks of progressive word finding difficuly, confusion, and gait impairment.  She [place] CNS imaging locally in [place] at request of her PCP which demonstrated enhancing frontal masses consistent with primary brain tumor.  She then [place] crani[place] and [place].  This was followed by several weeks of inpatient rehabilitation, where she was discharged with independent gait and otherwise fully intact.  She does acknowledge some difficulty with communication, and "slower" pace of activities such as dressing in the morning.  She denies any seizures or headaches, and has fully weaned off dexamethasone.      Medications: Current Outpatient Medications on File Prior to Visit  Medication  Sig Dispense Refill   aspirin 325 MG tablet Take 325 mg by mouth daily.     Cholecalciferol (VITAMIN D3) 2000 UNITS capsule Take 2,000 Units by mouth daily.       dexamethasone (DECADRON) 2 MG tablet Take 1 tablet (2 mg total) by mouth daily. 30 tablet 1   levETIRAcetam (KEPPRA) 250 MG tablet TAKE 1 TABLET BY MOUTH TWICE A DAY 60 tablet 2   LORazepam (ATIVAN) 0.5 MG tablet 1 tablet po 30 minutes prior to radiation or MRI (Patient not taking: Reported on 09/15/2019) 40 tablet 0   losartan (COZAAR) 50 MG tablet      methylphenidate (RITALIN) 5 MG tablet Take 1 tablet (5 mg total) by mouth daily. 30 tablet 0   ondansetron (ZOFRAN) 8 MG tablet Take 1 tablet (8 mg total) by mouth 2 (two) times daily as needed (nausea and vomiting). May take 30-60 minutes prior to Temodar administration if nausea/vomiting occurs. 30 tablet 1   temozolomide (TEMODAR) 100 MG capsule Take 1 capsule (100 mg total) by mouth daily. May take on an empty stomach to decrease nausea & vomiting. (Patient not taking: Reported on 09/15/2019) 42 capsule 0   temozolomide (TEMODAR) 100 MG capsule Take 1 capsule (100 mg total) by mouth daily. May take on an empty stomach to decrease nausea & vomiting. (Patient not taking: Reported on 10/17/2019) 5 capsule 0   temozolomide (TEMODAR) 100 MG capsule Take 1 capsule (100 mg total) by mouth daily. May take on an empty stomach to decrease nausea & vomiting. 5 capsule 0   temozolomide (TEMODAR) 100 MG capsule Take 1 capsule (100 mg total) by  mouth daily. May take on an empty stomach to decrease nausea & vomiting. 5 capsule 0   temozolomide (TEMODAR) 140 MG capsule Take 1 capsule (140 mg total) by mouth daily. May take on an empty stomach to decrease nausea & vomiting. (Patient not taking: Reported on 10/17/2019) 5 capsule 0   temozolomide (TEMODAR) 140 MG capsule Take 1 capsule (140 mg total) by mouth daily. May take on an empty stomach to decrease nausea & vomiting. 5 capsule 0    temozolomide (TEMODAR) 140 MG capsule Take 1 capsule (140 mg total) by mouth daily. May take on an empty stomach to decrease nausea & vomiting. 5 capsule 0   temozolomide (TEMODAR) 20 MG capsule Take 1 capsule (20 mg total) by mouth daily. May take on an empty stomach to decrease nausea & vomiting. (Patient not taking: Reported on 09/15/2019) 42 capsule 0   No current facility-administered medications on file prior to visit.    Allergies:  Allergies  Allergen Reactions   Amoxicillin    Past Medical History: No past medical history on file. Past Surgical History: none  Social History:  Social History   Socioeconomic History   Marital status: Married    Spouse name: Not on file   Number of children: Not on file   Years of education: Not on file   Highest education level: Not on file  Occupational History   Not on file  Tobacco Use   Smoking status: Unknown If Ever Smoked   Smokeless tobacco: Never Used   Tobacco comment: no smoking   Substance and Sexual Activity   Alcohol use: No   Drug use: Not on file   Sexual activity: Not on file  Other Topics Concern   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    Social Determinants of Health   Financial Resource Strain:    Difficulty of Paying Living Expenses: Not on file  Food Insecurity:    Worried About Charity fundraiser in the Last Year: Not on file   [employer] of Food in the Last Year: Not on file  Transportation Needs:    Lack of Transportation (Medical): Not on file   Lack of Transportation (Non-Medical): Not on file  Physical Activity:    Days of Exercise per Week: Not on file   Minutes of Exercise per Session: Not on file  Stress:    Feeling of Stress : Not on file  Social Connections:    Frequency of Communication with Friends and Family: Not on file   Frequency of Social Gatherings with Friends and Family: Not on file   Attends Religious Services:  Not on file   Active Member of Clubs or Organizations: Not on file   Attends Archivist Meetings: Not on file   Marital Status: Not on file  Intimate Partner Violence:    Fear of Current or Ex-Partner: Not on file   Emotionally Abused: Not on file   Physically Abused: Not on file   Sexually Abused: Not on file   Family History:  Family History  Problem Relation Age of Onset   Stroke Other        grandmother   Heart attack Other        grandfather - 38 and CHF     Review of Systems: Constitutional: Doesn't report fevers, chills or abnormal weight loss Eyes: Doesn't report blurriness of vision Ears, nose, mouth, throat, and face: Doesn't  report sore throat Respiratory: Doesn't report cough, dyspnea or wheezes Cardiovascular: Doesn't report palpitation, chest discomfort  Gastrointestinal:  Doesn't report nausea, constipation, diarrhea GU: Doesn't report incontinence Skin: Doesn't report skin rashes Neurological: Per HPI Musculoskeletal: Doesn't report joint pain Behavioral/Psych: Doesn't report anxiety  Physical Exam: Vitals:   01/26/20 1230  BP: (!) 141/69  Pulse: 70  Resp: 20  Temp: 97.8 F (36.6 C)  SpO2: 100%   KPS: 80. General: Alert, cooperative, pleasant, in no acute distress Head: Normal EENT: No conjunctival injection or scleral icterus.  Lungs: Resp effort normal Cardiac: Regular rate Abdomen: Non-distended abdomen Skin: No rashes cyanosis or petechiae. Extremities: No clubbing or edema  Neurologic Exam: Mental Status: Awake, alert, attentive to examiner. Oriented to self and environment. Language has some impairment with fluency and comprehension.  Age advanced psychomotor slowing.  Features of abulia Cranial Nerves: Visual acuity is grossly normal. Visual fields are full. Extra-ocular movements intact. No ptosis. Face is symmetric Motor: Tone and bulk are normal. Power is full in both arms and legs, with mild hip girdle weakness.  Reflexes are symmetric, no pathologic reflexes present.  Sensory: Intact to light touch Gait: Normal, deferred tandem.   Labs: I have reviewed the data as listed    Component Value Date/Time   NA 145 12/22/2019 1207   K 3.5 12/22/2019 1207   CL 112 (H) 12/22/2019 1207   CO2 26 12/22/2019 1207   GLUCOSE 96 12/22/2019 1207   BUN 22 12/22/2019 1207   CREATININE 0.81 12/22/2019 1207   CALCIUM 10.0 12/22/2019 1207   PROT 6.3 (L) 12/22/2019 1207   ALBUMIN 3.5 12/22/2019 1207   AST 12 (L) 12/22/2019 1207   ALT 12 12/22/2019 1207   ALKPHOS 48 12/22/2019 1207   BILITOT 0.6 12/22/2019 1207   GFRNONAA >60 12/22/2019 1207   GFRAA >60 12/22/2019 1207   Lab Results  Component Value Date   WBC 5.6 12/22/2019   NEUTROABS 4.3 12/22/2019   HGB 12.1 12/22/2019   HCT 36.2 12/22/2019   MCV 92.8 12/22/2019   PLT 178 12/22/2019    Pre-op MRI:   Post-op MRI:    Imaging:  [facility] Clinician Interpretation: I have personally reviewed the CNS images as listed.  My interpretation, in the context of the patient's clinical presentation, is treatment effect vs true progression  MR BRAIN W WO CONTRAST  Result Date: 01/26/2020 CLINICAL DATA:  Glioblastoma.  Surgical resection January 2021. EXAM: MRI HEAD WITHOUT AND WITH CONTRAST TECHNIQUE: Multiplanar, multiecho pulse sequences of the brain and surrounding structures were obtained without and with intravenous contrast. CONTRAST:  69m GADAVIST GADOBUTROL 1 MMOL/ML IV SOLN COMPARISON:  MRI head 09/10/2019, 11/17/2019 FINDINGS: Brain: Today's study is motion degraded particularly on the postcontrast imaging limiting anatomical detail. Left frontal craniotomy for tumor resection. Surgical cavity in the high left anterior frontal lobe is unchanged. Minimal enhancement in the cavity wall is stable. Irregular enhancement along the roof of the left lateral ventricle involving the corpus callosum has progressed in the interval. This is most likely tumor  progression. Patchy areas of enhancement in the right frontal lobe appear improved in the interval. This is limited by motion. Chronic hemorrhage in the right frontal lobe is stable with improvement in surrounding enhancement. Diffuse white matter hyperintensity on FLAIR bilaterally with mild progression. Generalized atrophy and ventricular enlargement is stable. Negative for acute infarct. Vascular: Negative for hyperdense vessel Skull and upper cervical spine: Left frontal craniotomy. Sinuses/Orbits: Paranasal sinuses clear.  Negative orbit Other: None  IMPRESSION: Progression of enhancement in the left corpus callosum and adjacent periventricular white matter most compatible with tumor progression. This area now measures 15 x 27 mm. Patchy areas of enhancement in the right frontal lobe appear improved. Diffuse white matter hyperintensity on FLAIR shows mild progression. Today's study is degraded by motion particularly on the postcontrast images. Electronically Signed   By: Franchot Gallo M.D.   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  MRI brain demonstrates focal progression of disease superior to the left lateral ventricle.  All other foci of disease, visible in bilateral hemispheres going back to 09/10/19 and including primary tumor/resection site, are stable or regressed.  Etiology of growth foci is either organic tumor progression or radio-inflammatory process leading to blood brain barrier breakdown.  There doesn't appear to be significant mass effect when viewed from coronal perspective.   We ultimately recommended continuing treatment with cycle #5 Temozolomide, still at 150 mg/m2, on for five days and off for twenty three days in twenty eight day cycles. The patient will have a complete blood count performed on days 21 and 28 of each cycle,  and a comprehensive metabolic panel performed on day 28 of each cycle. Labs may need to be performed more often. Zofran will prescribed for home use for nausea/vomiting.   Chemotherapy should be held for the following:  ANC less than 1,000  Platelets less than 100,000  LFT or creatinine greater than 2x ULN  If clinical concerns/contraindications develop  Because of progressive changes we will recommend very close imaging follow up; next MRI should be performed in 1 month prior to next planned Temozolomide cycle.  Will con't Keppra 284m BID.  Will increase Ritalin to 133mBID given benefit seen at introductory dose level.  She should return to clinic in 4 weeks prior to cycle #5 with MRI brain for evaluation.  All questions were answered. The patient knows to call the clinic with any problems, questions or concerns. No barriers to learning were detected.  I have spent a total of 40 minutes of face-to-face and non-face-to-face time, excluding clinical staff time, preparing to see patient, ordering tests and/or medications, counseling the patient, and independently interpreting results and communicating results to the patient/family/caregiver.   ZaVentura SellersMD Medical Director of Neuro-Oncology [facility]9/16/21 12:14 PM

## 2020-01-27 ENCOUNTER — Telehealth: Payer: Self-pay | Admitting: Internal Medicine

## 2020-01-27 NOTE — Telephone Encounter (Signed)
Scheduled per 9/16 los. Spoke with pt's husband and requested Lab/F/u same day as MRI. Pt's husband is aware of appt time and date.

## 2020-01-30 ENCOUNTER — Telehealth: Payer: Self-pay | Admitting: Internal Medicine

## 2020-01-30 NOTE — Telephone Encounter (Signed)
Released last OV note to [facility] to [facility] [place]  Release:  [phone]

## 2020-01-31 ENCOUNTER — Encounter: Payer: Self-pay | Admitting: *Deleted

## 2020-02-09 ENCOUNTER — Other Ambulatory Visit: Payer: Self-pay | Admitting: Radiation Therapy

## 2020-02-14 ENCOUNTER — Telehealth: Payer: Self-pay

## 2020-02-14 NOTE — Telephone Encounter (Signed)
Patient's husband, Frederico Hamman called office stating he was concerned with patient's mobility and treatment plan going forward, and wanted to talk to Dr. Mickeal Skinner. Dr. Mickeal Skinner made aware, requested for patient to have phone visit later this week. Scheduling message sent for phone visit on 02/16/20. Called and informed patient's husband of phone appointment on 02/16/20. Husband verbalized understanding.

## 2020-02-16 ENCOUNTER — Inpatient Hospital Stay: Payer: Medicare PPO | Attending: Internal Medicine | Admitting: Internal Medicine

## 2020-02-16 DIAGNOSIS — Z79899 Other long term (current) drug therapy: Secondary | ICD-10-CM | POA: Insufficient documentation

## 2020-02-16 DIAGNOSIS — R569 Unspecified convulsions: Secondary | ICD-10-CM

## 2020-02-16 DIAGNOSIS — Z923 Personal history of irradiation: Secondary | ICD-10-CM | POA: Insufficient documentation

## 2020-02-16 DIAGNOSIS — C711 Malignant neoplasm of frontal lobe: Secondary | ICD-10-CM | POA: Diagnosis not present

## 2020-02-16 NOTE — Progress Notes (Signed)
I connected with Dorothy Mason on 02/16/20 at 10:30 AM EDT by telephone visit and verified that I am speaking with the correct person using two identifiers.  I discussed the limitations, risks, security and privacy concerns of performing an evaluation and management service by telemedicine and the availability of in-person appointments. I also discussed with the patient that there may be a patient responsible charge related to this service. The patient expressed understanding and agreed to proceed.  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  There are no frank seizures.  Husband is concerned because symptoms tend to worsen during and following the week of chemotherapy.  She does have brain MRI scheduled for 02/27/20 here in [place]. Observations: Language and cognition at baseline Assessment and Plan: Glioblastoma multiforme of frontal lobe ([facility])  Focal seizure ([facility])  No medication changes recommended today.  We will evaluate MRI scan 'day-of' and see her in the clinic immediately following, rather than wait for tumor board meeting the next week.  If further progression we will consider second line or salvage therapies.  Follow Up Instructions: RTC on 10/18 following brain MRI.  I discussed the assessment and treatment plan with the patient.  The patient was provided an opportunity to ask questions and all were answered.  The patient agreed with the plan and demonstrated understanding of the instructions.    The patient was advised to call back or seek an in-person evaluation if the symptoms worsen or if the condition fails to improve as anticipated.  I provided 5-10 minutes of non-face-to-face time during this  enocunter.  Ventura Sellers, MD   I provided 15 minutes of non face-to-face telephone visit time during this encounter, and > 50% was spent counseling as documented under my assessment & plan.

## 2020-02-18 ENCOUNTER — Ambulatory Visit (HOSPITAL_COMMUNITY): Payer: Medicare PPO

## 2020-02-23 ENCOUNTER — Ambulatory Visit (HOSPITAL_COMMUNITY): Payer: Medicare PPO

## 2020-02-23 ENCOUNTER — Ambulatory Visit: Payer: Medicare PPO | Admitting: Internal Medicine

## 2020-02-23 ENCOUNTER — Other Ambulatory Visit: Payer: Medicare PPO

## 2020-02-27 ENCOUNTER — Ambulatory Visit (HOSPITAL_COMMUNITY)
Admission: RE | Admit: 2020-02-27 | Discharge: 2020-02-27 | Disposition: A | Discharge status: Home / Self Care | Payer: Medicare PPO | Source: Ambulatory Visit | Attending: Internal Medicine | Admitting: Internal Medicine

## 2020-02-27 ENCOUNTER — Inpatient Hospital Stay (HOSPITAL_BASED_OUTPATIENT_CLINIC_OR_DEPARTMENT_OTHER): Payer: Medicare PPO | Admitting: Internal Medicine

## 2020-02-27 ENCOUNTER — Other Ambulatory Visit: Payer: Self-pay

## 2020-02-27 ENCOUNTER — Inpatient Hospital Stay: Payer: Medicare PPO

## 2020-02-27 VITALS — BP 147/103 | HR 86 | Temp 98.2°F | Resp 20 | Ht 61.0 in

## 2020-02-27 DIAGNOSIS — R569 Unspecified convulsions: Secondary | ICD-10-CM

## 2020-02-27 DIAGNOSIS — C711 Malignant neoplasm of frontal lobe: Secondary | ICD-10-CM

## 2020-02-27 DIAGNOSIS — Z79899 Other long term (current) drug therapy: Secondary | ICD-10-CM | POA: Diagnosis not present

## 2020-02-27 DIAGNOSIS — Z923 Personal history of irradiation: Secondary | ICD-10-CM | POA: Diagnosis not present

## 2020-02-27 LAB — CBC WITH DIFFERENTIAL (CANCER CENTER ONLY)
Abs Immature Granulocytes: 0.02 10*3/uL (ref 0.00–0.07)
Basophils Absolute: 0 10*3/uL (ref 0.0–0.1)
Basophils Relative: 0 %
Eosinophils Absolute: 0 10*3/uL (ref 0.0–0.5)
Eosinophils Relative: 1 %
HCT: 38 % (ref 36.0–46.0)
Hemoglobin: 12.8 g/dL (ref 12.0–15.0)
Immature Granulocytes: 0 %
Lymphocytes Relative: 9 %
Lymphs Abs: 0.6 10*3/uL — ABNORMAL LOW (ref 0.7–4.0)
MCH: 30.6 pg (ref 26.0–34.0)
MCHC: 33.7 g/dL (ref 30.0–36.0)
MCV: 90.9 fL (ref 80.0–100.0)
Monocytes Absolute: 0.5 10*3/uL (ref 0.1–1.0)
Monocytes Relative: 7 %
Neutro Abs: 5.9 10*3/uL (ref 1.7–7.7)
Neutrophils Relative %: 83 %
Platelet Count: 243 10*3/uL (ref 150–400)
RBC: 4.18 MIL/uL (ref 3.87–5.11)
RDW: 12.3 % (ref 11.5–15.5)
WBC Count: 7.1 10*3/uL (ref 4.0–10.5)
nRBC: 0 % (ref 0.0–0.2)

## 2020-02-27 LAB — CMP (CANCER CENTER ONLY)
ALT: 50 U/L — ABNORMAL HIGH (ref 0–44)
AST: 32 U/L (ref 15–41)
Albumin: 3.6 g/dL (ref 3.5–5.0)
Alkaline Phosphatase: 51 U/L (ref 38–126)
Anion gap: 6 (ref 5–15)
BUN: 13 mg/dL (ref 8–23)
CO2: 31 mmol/L (ref 22–32)
Calcium: 10.1 mg/dL (ref 8.9–10.3)
Chloride: 106 mmol/L (ref 98–111)
Creatinine: 0.75 mg/dL (ref 0.44–1.00)
GFR, Estimated: >60 mL/min (ref >60)
Glucose, Bld: 90 mg/dL (ref 70–99)
Potassium: 3.7 mmol/L (ref 3.5–5.1)
Sodium: 143 mmol/L (ref 135–145)
Total Bilirubin: 0.2 mg/dL — ABNORMAL LOW (ref 0.3–1.2)
Total Protein: 6.5 g/dL (ref 6.5–8.1)

## 2020-02-27 IMAGING — MR MR HEAD WO/W CM
14 series · 48 of 48 positions shown · IV contrast (gadavist)
Comparison: Prior brain MRI examinations [DATE] and earlier.

CLINICAL DATA: Glioblastoma multiform a of frontal lobe. Brain/CNS
neoplasm, assess treatment response. Glioblastoma. Surgical
resection [DATE]. Radiation therapy completed [DATE].

EXAM:
MRI HEAD WITHOUT AND WITH CONTRAST
TECHNIQUE: Multiplanar, multiecho pulse sequences of the brain and surrounding
structures were obtained without and with intravenous contrast.
CONTRAST:  6mL GADAVIST GADOBUTROL 1 MMOL/ML IV SOLN

[Series 6: DWI · axial · 3.0mm · 1.36mm/px · z∈[-60,+97]mm · 5 of 106 slices shown (1 of 4)]
[im 1/106]
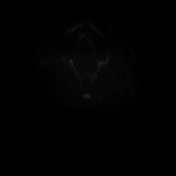
[im 27/106]
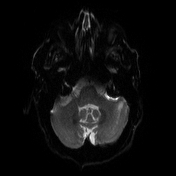
[im 53/106]
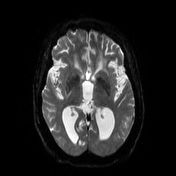
[im 79/106]
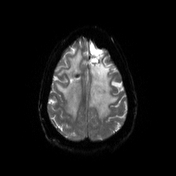
[im 106/106]
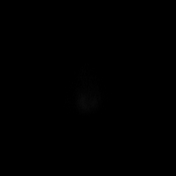

[Series 7: DWI · axial · 3.0mm · 1.36mm/px · z∈[-60,+97]mm · 3 of 54 slices shown (2 of 4)]
[im 1/54]
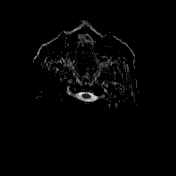
[im 27/54]
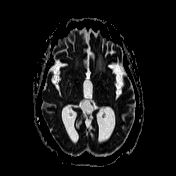
[im 54/54]
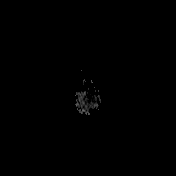

[Series 8: T1 · sagittal · 5.0mm · 0.75mm/px · 1 of 25 slices shown (1 of 2)]
[im 1/25]
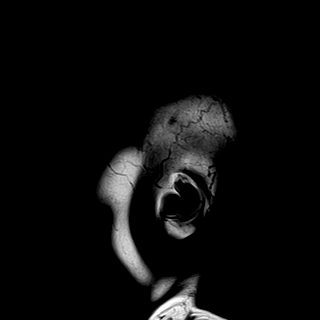

[Series 9: T2 · axial · 5.0mm · 0.62mm/px · 1 of 26 slices shown]
[im 1/26]
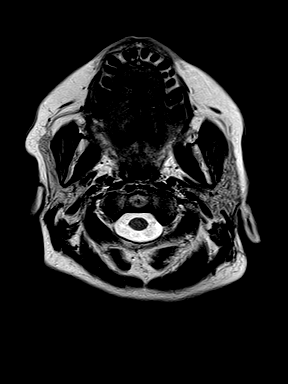

[Series 10: mip_images(sw) · axial · 24.0mm · 0.75mm/px · z∈[-49,+94]mm · 3 of 49 slices shown]
[im 1/49]
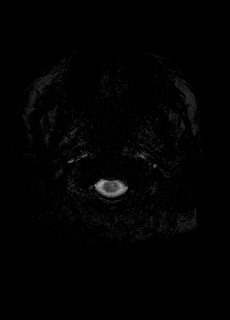
[im 25/49]
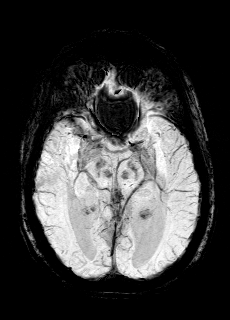
[im 49/49]
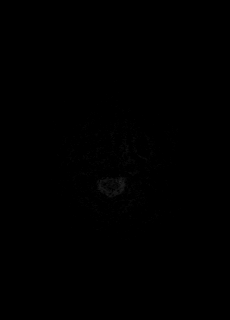

[Series 11: swi_images · axial · 3.0mm · 0.75mm/px · z∈[-59,+104]mm · 3 of 56 slices shown]
[im 1/56]
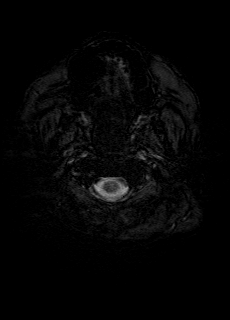
[im 28/56]
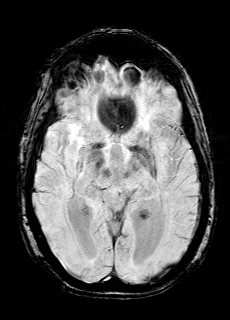
[im 56/56]
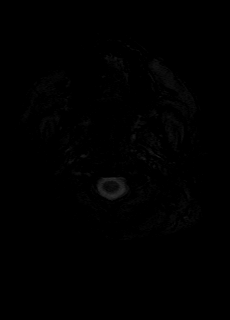

[Series 12: FLAIR · axial · 3.0mm · 0.75mm/px · z∈[-56,+101]mm · 3 of 54 slices shown]
[im 1/54]
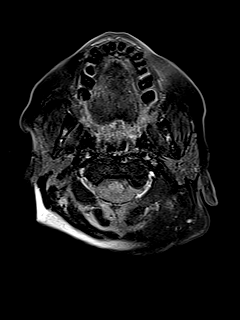
[im 27/54]
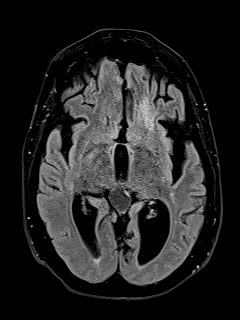
[im 54/54]
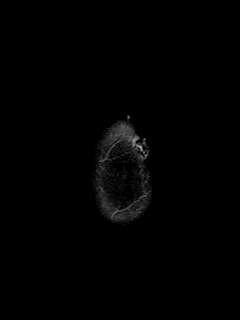

[Series 13: T1 · axial · 1.0mm · 0.94mm/px · z∈[-64,+109]mm · 9 of 176 slices shown (2 of 2)]
[im 1/176]
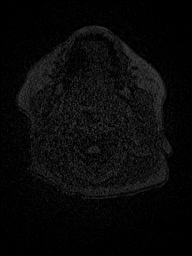
[im 22/176]
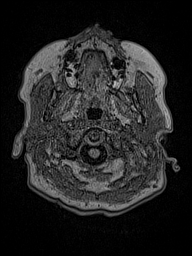
[im 44/176]
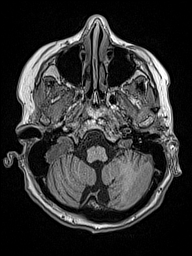
[im 66/176]
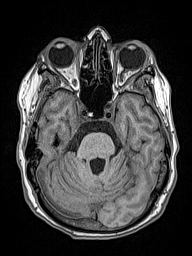
[im 88/176]
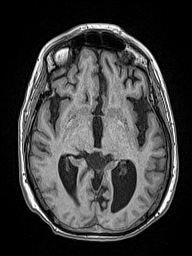
[im 110/176]
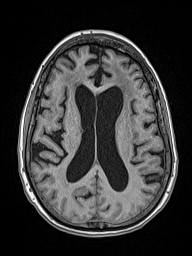
[im 132/176]
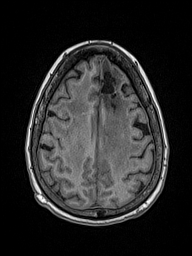
[im 154/176]
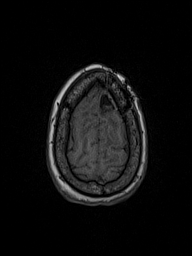
[im 176/176]
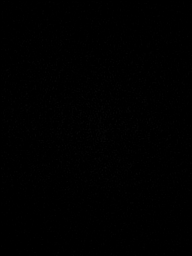

[Series 14: DWI · coronal · 5.0mm · 1.31mm/px · 4 of 76 slices shown (3 of 4)]
[im 1/76]
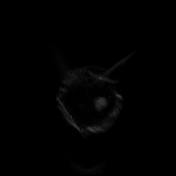
[im 26/76]
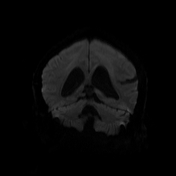
[im 51/76]
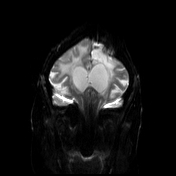
[im 76/76]
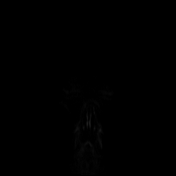

[Series 15: DWI · coronal · 5.0mm · 1.31mm/px · 2 of 38 slices shown (4 of 4)]
[im 1/38]
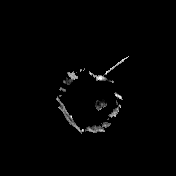
[im 38/38]
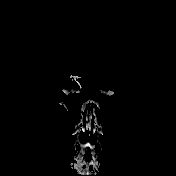

[Series 16: T2 post-contrast · coronal · 5.0mm · 0.57mm/px · 2 of 30 slices shown]
[im 1/30]
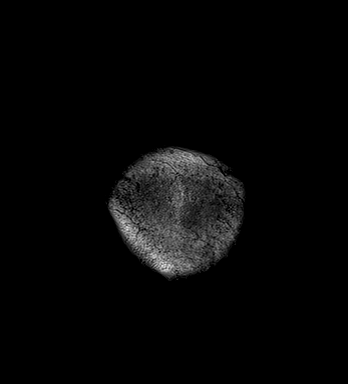
[im 30/30]
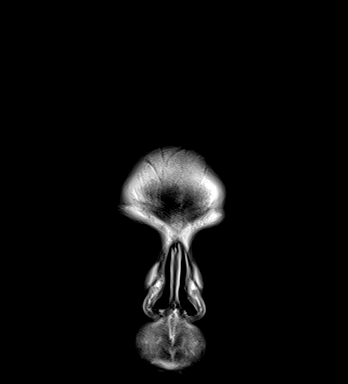

[Series 17: T1 post-contrast · axial · 1.0mm · 0.94mm/px · z∈[-64,+109]mm · 9 of 176 slices shown (1 of 3)]
[im 1/176]
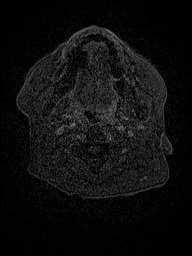
[im 22/176]
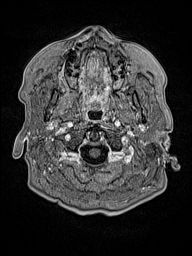
[im 44/176]
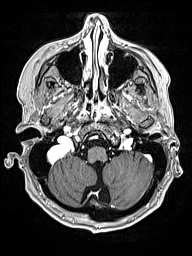
[im 66/176]
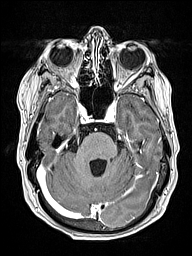
[im 88/176]
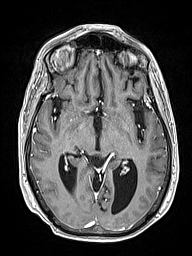
[im 110/176]
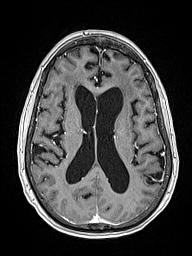
[im 132/176]
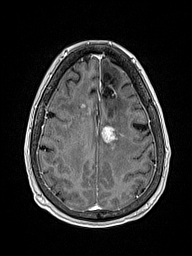
[im 154/176]
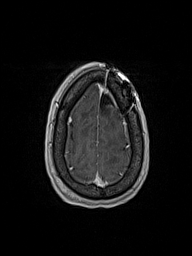
[im 176/176]
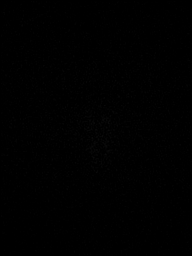

[Series 18: T1 post-contrast · coronal · 5.0mm · 0.43mm/px · 2 of 30 slices shown (2 of 3)]
[im 1/30]
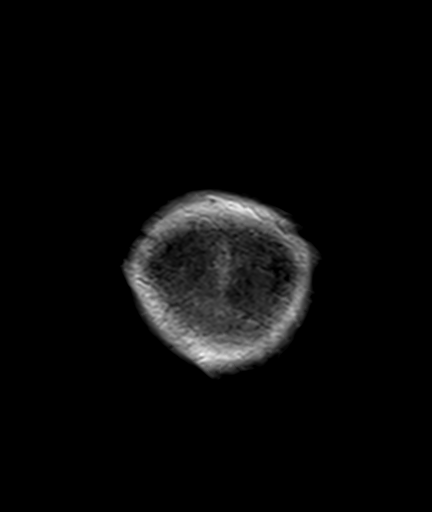
[im 30/30]
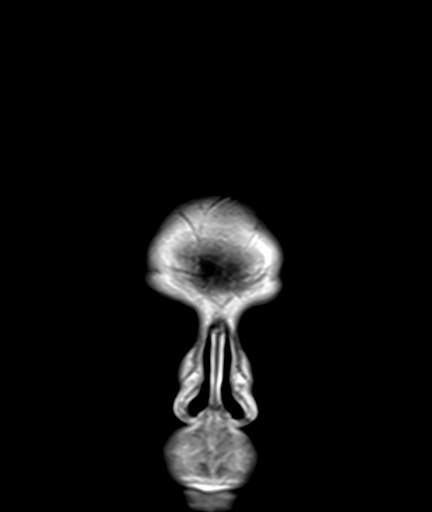

[Series 19: T1 post-contrast · sagittal · 5.0mm · 0.75mm/px · 1 of 25 slices shown (3 of 3)]
[im 1/25]
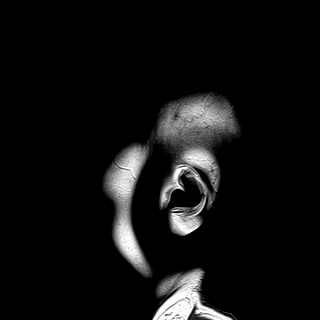

[48 of 48 positions shown; findings below may reference images not displayed]

FINDINGS: Brain:

Prior left frontal craniotomy and tumor resection. Surgical cavity
within the high anterior left frontal lobe with associated chronic
blood products and mild enhancement within the cavity wall,
unchanged.

Irregular and nodular enhancement along the roof of the left lateral
ventricle and involving the corpus callosum has not significantly
changed. As before, this measures up to 10 mm in greatest thickness
(for instance as seen on series 18, image 15). More ill-defined
enhancement immediately lateral to this is more conspicuous on the
current study but this appears secondary to partial obscuration by
motion degradation on the prior exam (series 18, image 15).

Stable subcentimeter focus of restricted diffusion along the margin
of the anterior left lateral ventricle which could be related to the
presence of chronic blood products at this site.

Sites of curvilinear and nodular enhancement within the right
frontal lobe are unchanged (for instance as seen on series 17,
images 115-132 and image 125). Unchanged small focus of precontrast
T1 hyperintense blood products within the periventricular right
frontal lobe.

Stable but extensive conflict T2/FLAIR hyperintense signal
abnormality within the bilateral frontal lobes.

No incidental acute infarction.

No extra-axial fluid collection.

No midline shift.

Unchanged 2.1 cm pineal cyst.

Vascular: Expected proximal arterial flow voids.

Skull and upper cervical spine: No focal marrow lesion.

Sinuses/Orbits: Visualized orbits show no acute finding. Minimal
ethmoid sinus mucosal thickening. No significant mastoid effusion.
IMPRESSION: Glioblastoma multiforme with stable examination as compared to
[DATE]. Specifically, the previously described progressive
enhancement within the left corpus callosum and adjacent
periventricular white matter has remained stable. Areas of
enhancement in the right frontal lobe have also remained stable.
Continued short interval MRI surveillance is recommended.

Stable diffuse T2 hyperintense signal abnormality throughout the
bilateral frontal lobes.

## 2020-02-27 MED ORDER — GADOBUTROL 1 MMOL/ML IV SOLN
6.0000 mL | Freq: Once | INTRAVENOUS | Status: AC | PRN
  Administered 2020-02-27: 6 mL via INTRAVENOUS

## 2020-02-27 MED ORDER — AMANTADINE HCL 100 MG PO CAPS: 100.0000 mg | ORAL_CAPSULE | Freq: Two times a day (BID) | ORAL | 3 refills | Status: AC

## 2020-02-27 NOTE — Progress Notes (Signed)
[facility] [address], [place] [address] [phone]   Interval Evaluation  Date of Service: 02/27/20 Patient Name: Dorothy Mason Patient MRN: [id number] Patient DOB: 05-27-1950 Provider: Ventura Sellers, MD  Identifying Statement:  Dorothy Mason is a 69 y.o. female with left frontal glioblastoma   Oncologic History: Oncology History  Glioblastoma multiforme of frontal lobe ([facility])  05/16/2019 Surgery   Craniotomy, debulking [place] in [place], [place]   07/04/2019 - 08/12/2019 Radiation Therapy   IMRT with concurrent Temozolomide 66m/m2     Biomarkers:  MGMT Unknown.  IDH 1/2 Wild type.  EGFR Unknown  TERT Unknown   Interval History:  SSHONITA RINCKpresents today for follow up, now having completed cycle #5 of adjuvant Temodar.  She and her husband describe overall decline over the past month.  She is no longer performing any ADLs independently.  She is slow to respond and not motivated to engage socially.  Symptoms continue to be worse during chemo weeks. Husband is having a very hard time with caring for her on his own.  No recent seizures.  H+P (06/20/19) Patient initially presented to medical attention in late December with several weeks of progressive word finding difficuly, confusion, and gait impairment.  [place] at request of her PCP which demonstrated enhancing frontal masses consistent with primary brain tumor.  She then [place].  This was followed by several weeks of inpatient rehabilitation, where she was discharged with independent gait and otherwise fully intact.  She does acknowledge some difficulty with communication, and "slower" pace of activities such as dressing in the morning.  She denies any seizures or headaches, and has fully weaned off dexamethasone.      Medications: Current Outpatient  Medications on File Prior to Visit  Medication Sig Dispense Refill  . aspirin 325 MG tablet Take 325 mg by mouth daily.    . Cholecalciferol (VITAMIN D3) 2000 UNITS capsule Take 2,000 Units by mouth daily.      .Marland Kitchendexamethasone (DECADRON) 2 MG tablet Take 1 tablet (2 mg total) by mouth daily. 30 tablet 1  . levETIRAcetam (KEPPRA) 250 MG tablet Take 1 tablet (250 mg total) by mouth 2 (two) times daily. 60 tablet 2  . LORazepam (ATIVAN) 0.5 MG tablet 1 tablet po 30 minutes prior to radiation or MRI (Patient not taking: Reported on 09/15/2019) 40 tablet 0  . losartan (COZAAR) 50 MG tablet     . methylphenidate (RITALIN) 10 MG tablet Take 1 tablet (10 mg total) by mouth 2 (two) times daily with breakfast and lunch. 60 tablet 0  . ondansetron (ZOFRAN) 8 MG tablet Take 1 tablet (8 mg total) by mouth 2 (two) times daily as needed (nausea and vomiting). May take 30-60 minutes prior to Temodar administration if nausea/vomiting occurs. 30 tablet 1  . temozolomide (TEMODAR) 100 MG capsule Take 1 capsule (100 mg total) by mouth daily. May take on an empty stomach to decrease nausea & vomiting. (Patient not taking: Reported on 09/15/2019) 42 capsule 0  . temozolomide (TEMODAR) 100 MG capsule Take 1 capsule (100 mg total) by mouth daily. May take on an empty stomach to decrease nausea & vomiting. (Patient not taking: Reported on 10/17/2019) 5 capsule 0  . temozolomide (TEMODAR) 100 MG capsule Take 1 capsule (100 mg total) by mouth daily. May take on an empty stomach  to decrease nausea & vomiting. 5 capsule 0  . temozolomide (TEMODAR) 100 MG capsule Take 1 capsule (100 mg total) by mouth daily. May take on an empty stomach to decrease nausea & vomiting. 5 capsule 0  . temozolomide (TEMODAR) 140 MG capsule Take 1 capsule (140 mg total) by mouth daily. May take on an empty stomach to decrease nausea & vomiting. (Patient not taking: Reported on 10/17/2019) 5 capsule 0  . temozolomide (TEMODAR) 140 MG capsule Take 1 capsule (140  mg total) by mouth daily. May take on an empty stomach to decrease nausea & vomiting. 5 capsule 0  . temozolomide (TEMODAR) 140 MG capsule Take 1 capsule (140 mg total) by mouth daily. May take on an empty stomach to decrease nausea & vomiting. 5 capsule 0  . temozolomide (TEMODAR) 140 MG capsule Take 1 capsule (140 mg total) by mouth daily. May take on an empty stomach to decrease nausea & vomiting. 5 capsule 0  . temozolomide (TEMODAR) 20 MG capsule Take 1 capsule (20 mg total) by mouth daily. May take on an empty stomach to decrease nausea & vomiting. (Patient not taking: Reported on 09/15/2019) 42 capsule 0   No current facility-administered medications on file prior to visit.    Allergies:  Allergies  Allergen Reactions  . Amoxicillin    Past Medical History: No past medical history on file. Past Surgical History: none  Social History:  Social History   Socioeconomic History  . Marital status: Married    Spouse name: Not on file  . Number of children: Not on file  . Years of education: Not on file  . Highest education level: Not on file  Occupational History  . Not on file  Tobacco Use  . Smoking status: Unknown If Ever Smoked  . Smokeless tobacco: Never Used  . Tobacco comment: no smoking   Substance and Sexual Activity  . Alcohol use: No  . Drug use: Not on file  . Sexual activity: Not on file  Other Topics Concern  . 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    Social Determinants of Health   Financial Resource Strain:   . Difficulty of Paying Living Expenses: Not on file  Food Insecurity:   . Worried About Charity fundraiser in the Last Year: Not on file  . Ran Out of Food in the Last Year: Not on file  Transportation Needs:   . Lack of Transportation (Medical): Not on file  . Lack of Transportation (Non-Medical): Not on file  Physical Activity:   . Days of Exercise per Week: Not on file  . Minutes of Exercise per  Session: Not on file  Stress:   . Feeling of Stress : Not on file  Social Connections:   . Frequency of Communication with Friends and Family: Not on file  . Frequency of Social Gatherings with Friends and Family: Not on file  . Attends Religious Services: Not on file  . Active Member of Clubs or Organizations: Not on file  . Attends Archivist Meetings: Not on file  . Marital Status: Not on file  Intimate Partner Violence:   . Fear of Current or Ex-Partner: Not on file  . Emotionally Abused: Not on file  . Physically Abused: Not on file  . Sexually Abused: Not on file   Family History:  Family History  Problem Relation Age of Onset  . Stroke Other  grandmother  . Heart attack Other        grandfather - 29 and CHF     Review of Systems: Constitutional: Doesn't report fevers, chills or abnormal weight loss Eyes: Doesn't report blurriness of vision Ears, nose, mouth, throat, and face: Doesn't report sore throat Respiratory: Doesn't report cough, dyspnea or wheezes Cardiovascular: Doesn't report palpitation, chest discomfort  Gastrointestinal:  Doesn't report nausea, constipation, diarrhea GU: Doesn't report incontinence Skin: Doesn't report skin rashes Neurological: Per HPI Musculoskeletal: Doesn't report joint pain Behavioral/Psych: Doesn't report anxiety  Physical Exam: Vitals:   02/27/20 1453  BP: (!) 147/103  Pulse: 86  Resp: 20  Temp: 98.2 F (36.8 C)  SpO2: 100%   KPS: 60. General: Alert, cooperative, pleasant, in no acute distress Head: Normal EENT: No conjunctival injection or scleral icterus.  Lungs: Resp effort normal Cardiac: Regular rate Abdomen: Non-distended abdomen Skin: No rashes cyanosis or petechiae. Extremities: No clubbing or edema  Neurologic Exam: Mental Status: Awake, alert, attentive to examiner. Oriented to self and environment. Language has some impairment with fluency and comprehension.  Age advanced psychomotor  slowing.  Features of abulia Cranial Nerves: Visual acuity is grossly normal. Visual fields are full. Extra-ocular movements intact. No ptosis. Face is symmetric Motor: Tone and bulk are normal. Power is full in both arms and legs, with mild hip girdle weakness. Reflexes are symmetric, no pathologic reflexes present.  Sensory: Intact to light touch Gait: Deferred, severe dystaxia  Labs: I have reviewed the data as listed    Component Value Date/Time   NA 143 01/26/2020 1207   K 3.4 (L) 01/26/2020 1207   CL 110 01/26/2020 1207   CO2 25 01/26/2020 1207   GLUCOSE 114 (H) 01/26/2020 1207   BUN 18 01/26/2020 1207   CREATININE 0.71 01/26/2020 1207   CALCIUM 9.4 01/26/2020 1207   PROT 6.1 (L) 01/26/2020 1207   ALBUMIN 3.5 01/26/2020 1207   AST 22 01/26/2020 1207   ALT 37 01/26/2020 1207   ALKPHOS 50 01/26/2020 1207   BILITOT 0.5 01/26/2020 1207   GFRNONAA >60 01/26/2020 1207   GFRAA >60 01/26/2020 1207   Lab Results  Component Value Date   WBC 7.1 02/27/2020   NEUTROABS 5.9 02/27/2020   HGB 12.8 02/27/2020   HCT 38.0 02/27/2020   MCV 90.9 02/27/2020   PLT 243 02/27/2020    Pre-op MRI:   Post-op MRI:    Imaging:  [facility] Clinician Interpretation: I have personally reviewed the CNS images as listed.  My interpretation, in the context of the patient's clinical presentation, is stable disease  MR BRAIN W WO CONTRAST  Result Date: 02/27/2020 CLINICAL DATA:  Glioblastoma multiform a of frontal lobe. Brain/CNS neoplasm, assess treatment response. Glioblastoma. Surgical resection January 2021. Radiation therapy completed 08/12/2019. EXAM: MRI HEAD WITHOUT AND WITH CONTRAST TECHNIQUE: Multiplanar, multiecho pulse sequences of the brain and surrounding structures were obtained without and with intravenous contrast. CONTRAST:  72m GADAVIST GADOBUTROL 1 MMOL/ML IV SOLN COMPARISON:  Prior brain MRI examinations 01/26/2020 and earlier. FINDINGS: Brain: Prior left frontal craniotomy and  tumor resection. Surgical cavity within the high anterior left frontal lobe with associated chronic blood products and mild enhancement within the cavity wall, unchanged. Irregular and nodular enhancement along the roof of the left lateral ventricle and involving the corpus callosum has not significantly changed. As before, this measures up to 10 mm in greatest thickness (for instance as seen on series 18, image 15). More ill-defined enhancement immediately lateral to this is more  conspicuous on the current study but this appears secondary to partial obscuration by motion degradation on the prior exam (series 18, image 15). Stable subcentimeter focus of restricted diffusion along the margin of the anterior left lateral ventricle which could be related to the presence of chronic blood products at this site. Sites of curvilinear and nodular enhancement within the right frontal lobe are unchanged (for instance as seen on series 17, images 115-132 and image 125). Unchanged small focus of precontrast T1 hyperintense blood products within the periventricular right frontal lobe. Stable but extensive conflict K1/QAESL hyperintense signal abnormality within the bilateral frontal lobes. No incidental acute infarction. No extra-axial fluid collection. No midline shift. 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 Vascular: Expected proximal arterial flow voids. Skull and upper cervical spine: No focal marrow lesion. Sinuses/Orbits: Visualized orbits show no acute finding. Minimal ethmoid sinus mucosal thickening. No significant mastoid effusion. IMPRESSION: Glioblastoma multiforme with stable examination as compared to 01/26/2020. Specifically, the previously described progressive enhancement within the left corpus callosum and adjacent periventricular white matter has remained stable. Areas of enhancement in the right frontal lobe have also remained stable. Continued short interval MRI surveillance is recommended. Stable diffuse T2  hyperintense signal abnormality throughout the bilateral frontal lobes. Electronically Signed   By: Kellie Simmering DO   On: 02/27/2020 15:11   Assessment/Plan Glioblastoma multiforme of frontal lobe ([facility])  Focal seizure ([facility])   Hanley Hays presents today now having completed cycle #5 of 5-day Temodar.  Brain MRI demonstrates stability of enhancing burden of tumor, including components that were newly visualized on prior scan.  Leukomalacia and atrophy are re-demonstrated and are signficant when compared to initial MRI scan from January 2021.  Clinically, she demonstrates significant frontal lobe dysfunction, with abulia, bradykinesia, slow processing.    We ultimately recommended deferring further chemotherapy due to declining functional status and poor overall tolerance of therapy.  Goals of care, including pathways involving hospice care, were discussed.  Husband will likely make arrangement for full-time care (either in-home or facility) given increasing care burdens placed on himself and other family members.  For clinical changes, bradykinesia, cognitive impairment will recommend trial of amantadine 187m twice per day.   Will con't Keppra 2551mBID.  May continue Ritalin 1069mID for alertness/energy deficits.  We ask that SusDOLL FRAZEEturn to clinic in 2 months following next brain MRI, or sooner as needed.  All questions were answered. The patient knows to call the clinic with any problems, questions or concerns. No barriers to learning were detected.  I have spent a total of 40 minutes of face-to-face and non-face-to-face time, excluding clinical staff time, preparing to see patient, ordering tests and/or medications, counseling the patient, and independently interpreting results and communicating results to the patient/family/caregiver.   ZacVentura SellersD Medical Director of Neuro-Oncology [facility] WesWoodman/18/21 3:00 PM

## 2020-02-28 ENCOUNTER — Ambulatory Visit: Payer: Medicare PPO | Admitting: Internal Medicine

## 2020-02-28 ENCOUNTER — Other Ambulatory Visit: Payer: Medicare PPO

## 2020-02-28 ENCOUNTER — Telehealth: Payer: Self-pay | Admitting: Internal Medicine

## 2020-02-28 NOTE — Telephone Encounter (Signed)
Scheduled per 10/18 los.spoke with pt's husband. Requested afternoons and is aware of appt times and dates. Cancelled appt son 10/25 per ZW.

## 2020-03-05 ENCOUNTER — Encounter: Payer: Self-pay | Admitting: Internal Medicine

## 2020-03-05 ENCOUNTER — Other Ambulatory Visit: Payer: Medicare PPO

## 2020-03-05 ENCOUNTER — Inpatient Hospital Stay: Payer: Medicare PPO

## 2020-03-05 ENCOUNTER — Ambulatory Visit: Payer: Medicare PPO | Admitting: Internal Medicine

## 2020-03-05 ENCOUNTER — Telehealth: Payer: Self-pay | Admitting: *Deleted

## 2020-03-05 MED ORDER — METHYLPHENIDATE HCL 10 MG PO TABS: 10.0000 mg | ORAL_TABLET | Freq: Two times a day (BID) | ORAL | 0 refills | Status: AC

## 2020-03-05 NOTE — Addendum Note (Signed)
Addended by: Ventura Sellers on: 03/05/2020 04:12 PM   Modules accepted: Orders

## 2020-03-05 NOTE — Telephone Encounter (Signed)
Patients husband called for refill of methylphenidate (Ritalin) 10 mg to [facility] in [place].

## 2020-03-06 ENCOUNTER — Telehealth: Payer: Self-pay | Admitting: *Deleted

## 2020-03-06 NOTE — Telephone Encounter (Signed)
Received call from [employer] with [facility].  She called to advise that patient and spouse have decided to proceed with hospice. They will establish with local hospice (in [place]) and will transfer hospice services when they get moved back to [place].

## 2020-03-08 ENCOUNTER — Telehealth: Payer: Self-pay

## 2020-03-08 NOTE — Telephone Encounter (Signed)
Voice message received from NP [facility] advising pts husband has declined hospice because they will not issue a medical cannabis card. Please advise.

## 2020-03-22 ENCOUNTER — Encounter: Payer: Self-pay | Admitting: Internal Medicine

## 2020-03-30 ENCOUNTER — Telehealth: Payer: Self-pay

## 2020-03-30 NOTE — Telephone Encounter (Signed)
Dorothy Mason with [facility] LM wanting to know the following:  1. Is a hospice referral is ok? 2. If yes, will Dr. Mickeal Skinner be the attending?  Dorothy Mason can be reached at [phone]

## 2020-04-02 ENCOUNTER — Telehealth: Payer: Self-pay | Admitting: *Deleted

## 2020-04-02 NOTE — Telephone Encounter (Signed)
Attempted to reach [place] with [facility], left message pending call back.

## 2020-04-10 ENCOUNTER — Telehealth: Payer: Self-pay | Admitting: *Deleted

## 2020-04-10 NOTE — Telephone Encounter (Signed)
Received vm call from female stating that he was calling to discuss home care.  Returned call & spoke with Frederico Hamman, husband of pt & he states that we should get a call from [facility] with [facility] for home care arrangements.  Pt should be moving to [facility] either tomorrow or the next day.  Frederico Hamman can be reached at [phone].  Message routed to Shelle Iron RN

## 2020-04-10 NOTE — Telephone Encounter (Signed)
[facility] hospice notified patient will be moving to the area tomorrow or the next day.

## 2020-04-11 ENCOUNTER — Telehealth: Payer: Self-pay

## 2020-04-11 NOTE — Telephone Encounter (Signed)
Spoke with patient's husband and scheduled a in-person Palliative Consult for 05/02/20 @ 8:30AM   COVID screening was negative. No pets in home. Patient lives with husband. They are moving tomorrow. Patient is receiving HH. Husband states this is a tentative appointment he may switch to hospice care.    Consent obtained; updated Outlook/Netsmart/Team List and Epic.

## 2020-04-12 ENCOUNTER — Encounter: Payer: Self-pay | Admitting: *Deleted

## 2020-04-27 ENCOUNTER — Ambulatory Visit (HOSPITAL_COMMUNITY): Payer: Medicare PPO

## 2020-05-01 ENCOUNTER — Ambulatory Visit: Payer: Medicare PPO | Admitting: Internal Medicine

## 2020-05-02 ENCOUNTER — Other Ambulatory Visit: Payer: Self-pay | Admitting: Internal Medicine

## 2020-08-02 ENCOUNTER — Other Ambulatory Visit (HOSPITAL_COMMUNITY): Payer: Self-pay

## 2020-08-10 DEATH — deceased
